# Patient Record
Sex: Female | Born: 1965 | Race: Asian | Hispanic: No | Marital: Single | Smoking: Never smoker
Health system: Southern US, Community
[De-identification: ages and names within clinical notes are randomized; demographics above are authoritative.]

## PROBLEM LIST (undated history)

## (undated) DIAGNOSIS — T7840XA Allergy, unspecified, initial encounter: Secondary | ICD-10-CM

## (undated) DIAGNOSIS — K219 Gastro-esophageal reflux disease without esophagitis: Secondary | ICD-10-CM

## (undated) DIAGNOSIS — R109 Unspecified abdominal pain: Secondary | ICD-10-CM

## (undated) DIAGNOSIS — R42 Dizziness and giddiness: Secondary | ICD-10-CM

## (undated) DIAGNOSIS — R079 Chest pain, unspecified: Secondary | ICD-10-CM

## (undated) DIAGNOSIS — E785 Hyperlipidemia, unspecified: Secondary | ICD-10-CM

## (undated) HISTORY — DX: Hyperlipidemia, unspecified: E78.5

## (undated) HISTORY — DX: Allergy, unspecified, initial encounter: T78.40XA

## (undated) HISTORY — DX: Chest pain, unspecified: R07.9

## (undated) HISTORY — DX: Dizziness and giddiness: R42

## (undated) HISTORY — DX: Gastro-esophageal reflux disease without esophagitis: K21.9

## (undated) HISTORY — DX: Unspecified abdominal pain: R10.9

---

## 1999-02-15 ENCOUNTER — Encounter: Payer: Self-pay | Admitting: Obstetrics and Gynecology

## 1999-02-15 ENCOUNTER — Ambulatory Visit (HOSPITAL_COMMUNITY): Admission: RE | Admit: 1999-02-15 | Discharge: 1999-02-15 | Payer: Self-pay | Admitting: *Deleted

## 1999-02-19 ENCOUNTER — Encounter: Payer: Self-pay | Admitting: *Deleted

## 1999-02-19 ENCOUNTER — Ambulatory Visit (HOSPITAL_COMMUNITY): Admission: RE | Admit: 1999-02-19 | Discharge: 1999-02-19 | Payer: Self-pay | Admitting: *Deleted

## 1999-04-26 ENCOUNTER — Ambulatory Visit (HOSPITAL_COMMUNITY): Admission: RE | Admit: 1999-04-26 | Discharge: 1999-04-26 | Payer: Self-pay | Admitting: Obstetrics and Gynecology

## 1999-07-26 ENCOUNTER — Encounter (HOSPITAL_COMMUNITY): Admission: AD | Admit: 1999-07-26 | Discharge: 1999-07-31 | Payer: Self-pay | Admitting: Obstetrics and Gynecology

## 1999-07-29 ENCOUNTER — Inpatient Hospital Stay (HOSPITAL_COMMUNITY): Admission: AD | Admit: 1999-07-29 | Discharge: 1999-07-29 | Payer: Self-pay | Admitting: Obstetrics and Gynecology

## 1999-07-30 ENCOUNTER — Encounter (INDEPENDENT_AMBULATORY_CARE_PROVIDER_SITE_OTHER): Payer: Self-pay

## 1999-07-30 ENCOUNTER — Inpatient Hospital Stay (HOSPITAL_COMMUNITY): Admission: AD | Admit: 1999-07-30 | Discharge: 1999-08-03 | Payer: Self-pay | Admitting: Obstetrics and Gynecology

## 1999-08-12 ENCOUNTER — Encounter: Payer: Self-pay | Admitting: Obstetrics and Gynecology

## 1999-08-12 ENCOUNTER — Ambulatory Visit (HOSPITAL_COMMUNITY): Admission: RE | Admit: 1999-08-12 | Discharge: 1999-08-12 | Payer: Self-pay | Admitting: Obstetrics and Gynecology

## 1999-08-25 ENCOUNTER — Encounter: Payer: Self-pay | Admitting: Obstetrics and Gynecology

## 1999-08-25 ENCOUNTER — Inpatient Hospital Stay (HOSPITAL_COMMUNITY): Admission: AD | Admit: 1999-08-25 | Discharge: 1999-08-27 | Payer: Self-pay | Admitting: Obstetrics and Gynecology

## 2000-01-24 ENCOUNTER — Other Ambulatory Visit: Admission: RE | Admit: 2000-01-24 | Discharge: 2000-01-24 | Payer: Self-pay | Admitting: Obstetrics and Gynecology

## 2001-02-18 ENCOUNTER — Other Ambulatory Visit: Admission: RE | Admit: 2001-02-18 | Discharge: 2001-02-18 | Payer: Self-pay | Admitting: Internal Medicine

## 2003-01-10 ENCOUNTER — Encounter: Admission: RE | Admit: 2003-01-10 | Discharge: 2003-01-10 | Payer: Self-pay | Admitting: Otolaryngology

## 2003-01-10 ENCOUNTER — Encounter: Payer: Self-pay | Admitting: Otolaryngology

## 2005-04-25 ENCOUNTER — Ambulatory Visit (HOSPITAL_COMMUNITY): Admission: RE | Admit: 2005-04-25 | Discharge: 2005-04-25 | Payer: Self-pay | Admitting: Internal Medicine

## 2006-05-11 ENCOUNTER — Ambulatory Visit (HOSPITAL_COMMUNITY): Admission: RE | Admit: 2006-05-11 | Discharge: 2006-05-11 | Payer: Self-pay | Admitting: Obstetrics and Gynecology

## 2006-05-22 ENCOUNTER — Encounter: Admission: RE | Admit: 2006-05-22 | Discharge: 2006-05-22 | Payer: Self-pay | Admitting: Obstetrics and Gynecology

## 2007-05-13 HISTORY — PX: OOPHORECTOMY: SHX86

## 2007-05-24 ENCOUNTER — Ambulatory Visit (HOSPITAL_COMMUNITY): Admission: RE | Admit: 2007-05-24 | Discharge: 2007-05-24 | Payer: Self-pay | Admitting: Obstetrics and Gynecology

## 2007-07-08 ENCOUNTER — Encounter (INDEPENDENT_AMBULATORY_CARE_PROVIDER_SITE_OTHER): Payer: Self-pay | Admitting: Obstetrics and Gynecology

## 2007-07-08 ENCOUNTER — Ambulatory Visit (HOSPITAL_COMMUNITY): Admission: RE | Admit: 2007-07-08 | Discharge: 2007-07-09 | Payer: Self-pay | Admitting: Obstetrics and Gynecology

## 2008-05-29 ENCOUNTER — Ambulatory Visit (HOSPITAL_COMMUNITY): Admission: RE | Admit: 2008-05-29 | Discharge: 2008-05-29 | Payer: Self-pay | Admitting: Obstetrics and Gynecology

## 2009-06-04 ENCOUNTER — Ambulatory Visit (HOSPITAL_COMMUNITY): Admission: RE | Admit: 2009-06-04 | Discharge: 2009-06-04 | Payer: Self-pay | Admitting: Gynecology

## 2010-06-02 ENCOUNTER — Encounter: Payer: Self-pay | Admitting: Obstetrics and Gynecology

## 2010-06-05 ENCOUNTER — Ambulatory Visit (HOSPITAL_COMMUNITY)
Admission: RE | Admit: 2010-06-05 | Discharge: 2010-06-05 | Payer: Self-pay | Source: Home / Self Care | Attending: Obstetrics and Gynecology | Admitting: Obstetrics and Gynecology

## 2010-09-24 NOTE — Op Note (Signed)
NAMEJAYLIAH, [name]                   ACCOUNT NO.:  [id number]   MEDICAL RECORD NO.:  [id number]          PATIENT TYPE:  OIB   LOCATION:  9305                          FACILITY:  [facility]   PHYSICIAN:  Malachi Pro. Ambrose Mantle, M.D. 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   OPERATION:  Left salpingo-oophorectomy.   SURGEON:  Dr. Tracey Harries.   ANESTHESIA:  General anesthesia.   The patient was brought to the operating room and placed under  satisfactory general anesthesia.  She was placed in frog-leg position.  The abdomen, vagina and urethra were prepped with Betadine solution. I  attempted to insert a 16-gauge Foley catheter but the urethra was too  small.  We therefore waited for a 12-gauge catheter. It easily entered  the urethra and it was then hooked to straight drain.  The patient was  placed supine.  The abdomen was draped as a sterile field.  The old C-  section incision was difficult to identify but I think I was able to  identify it and I used approximately 4 inches of it, 2 inches on either  side of the midline to go through the skin, subcutaneous tissue and  fascia. The fascia was enlarged transversely, separated from the rectus  muscles superiorly and inferiorly. The rectus muscle was divided in the  midline.  The peritoneum was opened vertically.  The incision was then  stretched to allow visualization.  I did pelvic washings. Because of the  small incision, I was unable to examine the upper abdomen. There did not  appear to be any pelvic abnormalities other than the left ovary. The  posterior cul-de-sac was normal.  The uterus which was retroverted was  raised into the operative field, it appeared normal. The anterior cul-de-  sac was normal.  The right tube and ovary were  completely normal. There  was no evidence of any ovarian pathology on the right. The left ovary  contained what appeared to be a dermoid. The left tube was normal. The  left ovary was about 3-1/2 cm in diameter. I had hoped to be able to  shell out the dermoid and leave a pretty much normal ovary, however, on  thorough inspection it did not appear to be any significant amount of  normal ovarian tissue other than what was in the dermoid. I therefore  made a decision to do a left salpingo-oophorectomy.  I identified the  infundibulopelvic ligament, divided it between two clamps and then took  most of the left tube along with the remainder of the ovary along the  mesovarium and mesosalpinx. I doubly suture ligated both clamps, bovied  the area in between the two  ligatures and sent the left tube and ovary  for frozen section. I had done pelvic washings and seeing no other  pathology, I went ahead and closed the abdominal wall with interrupted  sutures of #  0 Vicryl on the rectus muscle and peritoneum as one layer,  then I closed the fascia with two running sutures of #0 Vicryl, the  subcu tissue with a running 3-0 Vicryl and the skin was closed with  automatic staples.  The patient seemed to tolerate the procedure well.  At the end of the procedure Dr. 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 I  asked him if I should send the washings, he said it never hurt to send  the washings  in case further section showed an abnormality or malignancy, so  therefore the pelvic washings were sent. The patient seemed to tolerate  the procedure well.  Blood loss was less than 25 mL.  Sponge and needle  counts were correct and she was returned to recovery in satisfactory  condition.      Malachi Pro. Ambrose Mantle, M.D.  Electronically Signed     TFH/MEDQ  D:  07/08/2007  T:  07/09/2007  Job:  [id number]

## 2010-09-24 NOTE — H&P (Signed)
[name], [name]                   ACCOUNT NO.:  [id number]   MEDICAL RECORD NO.:  [id number]           PATIENT TYPE:   LOCATION:                                 FACILITY:   PHYSICIAN:  Malachi Pro. Ambrose Mantle, M.D. DATE OF BIRTH:  Apr 19, 1966   DATE OF ADMISSION:  07/08/2007  DATE OF DISCHARGE:                              HISTORY & PHYSICAL   HISTORY OF PRESENT ILLNESS:  This is a 45 year old [place] female  para 1-0-1-1 whose last menstrual period was June 11, 2007, who was  admitted to the hospital for surgical exploration of the left adnexal  mass that by ultrasound appears to be a dermoid.  This patient was seen  for yearly exam on May 31, 2007, at which time she complained that  sometimes during intercourse it felt like the penis was hitting  something.  The uterus was upper limit of normal size, posterior and  tender.  There was a left ovary that felt about 3 x 3 cm.  Ultrasound  showed a probable left ovarian dermoid.  There was a retroverted uterus  and the left ovary appeared to have a 3.1 x 4.3 x 2.7 cm hyperechoic  mass extending from the superior surface of the left ovary compatible  with a dermoid.  The ultrasound was repeated after her period in early  February and again showed the same findings.  At this time the highly  echogenic mass measured 3.25 x 2.54 x 2.15 cm.  The patient was advised  to consider surgery because of the presence of what appeared to be the  dermoid.   PAST MEDICAL HISTORY:  Reveals no known drug allergies.  She has had a C-  section.  She has had no major illnesses except for allergies to  different allergens.   REVIEW OF SYSTEMS:  Noncontributory.   SOCIAL HISTORY:  She is unemployed.  She does not drink or smoke.   FAMILY HISTORY:  Her mother and father are 49 and 22 respectively,  living and well.  She has 9 siblings.   MEDICATIONS:  Claritin.   PHYSICAL EXAM:  VITAL SIGNS:  Blood pressure 100/60, pulse 80, weight is  101  pounds.  GENERAL:  She is a well-developed, well-nourished [place] female in  no distress.  HEENT:  Reveal no cranial abnormalities.  Extraocular movements are  intact.  Nose and pharynx are clear.  NECK:  Supple without thyromegaly.  HEART:  Normal size and sounds.  No murmurs.  LUNGS:  Lungs are clear to auscultation.  ABDOMEN:  The abdomen is soft and flat, nontender.  The liver, spleen  and kidneys are not felt.  Vulva and vagina are clean.  Cervix is clean.  Uterus is retroverted, upper limit of normal size.  The right adnexa is  clear.  Left ovary feels 3 x 3 cm.   ADMITTING IMPRESSION:  Left adnexal mass presumably a dermoid on  ultrasound examination.  The patient is admitted for surgical  exploration, removal of the mass, hopefully to leave the remainder of  the ovary.  The patient does not  want hysterectomy or removal of the  other ovary.  The other ovary will be inspected carefully to see if  there is any evidence of bilaterality of the problem.  She has been  counseled about the surgical risks with the aid of an interpreter,  including but not limited to pulmonary embolus, wound disruption,  hemorrhage with need for reoperation and/or transfusion, nerve injury,  intestinal obstruction.  She understands and agrees to proceed.      Malachi Pro. Ambrose Mantle, M.D.  Electronically Signed     TFH/MEDQ  D:  07/07/2007  T:  07/07/2007  Job:  [id number]

## 2010-09-27 NOTE — Discharge Summary (Signed)
[facility]  Patient:    [name], [name] [facility]                    MRN: [id number] Adm. Date:  [id number] Disc. Date: [id number] Attending:  Malon Kindle                           Discharge Summary  ADMISSION DIAGNOSIS:          Intrauterine pregnancy at 40 weeks.  DISCHARGE DIAGNOSES:          1. Intrauterine pregnancy at 40 weeks.                               2. Cephalopelvic disproportion.                               3. Uterine leiomyomas.  PROCEDURES:                   Primary low transverse cesarean section.  COMPLICATIONS:                None.  CONSULTATIONS:                None.  HISTORY AND PHYSICAL:         This is a 45 year old [place] female gravida 1  para 0 with an EGA of [redacted] weeks by a 12-week ultrasound, with an EDC of July 26, 1999, who presented to the office with a complaint of regular contractions and as found to be 5 cm dilated.  Prenatal care was complicated by advanced maternal age, for which she had an amniocentesis which revealed normal 46,XX karyotype.  LABORATORY DATA:              Prenatal labs revealed blood type B positive with a negative antibody screen, STS nonreactive, rubella immune, hepatitis B surface antigen negative, HIV negative, gonorrhea and chlamydia negative. Amniocentesis revealed 46,XX.  Three-hour GTT was 85, 140, 178, and 127.  Group B strep was negative.  PAST HISTORY:                 Noncontributory.  FINDINGS ON ADMISSION:        Physical exam was significant for she was afebrile with stable vital signs.  Abdomen was soft with a fundal height of 40 cm. Fetal heart tracing was normal without decelerations.  On Dr. Andreas Newport first exam in the hospital, she was 6 cm, complete, and -2, and artificial rupture of membranes revealed light meconium fluid.  HOSPITAL COURSE:              Patient was admitted in labor and, as mentioned above, Dr. Ambrose Mantle performed an artificial rupture of  membranes for labor augmentation.  She had a protracted course and was started on Pitocin.  She did  eventually reach completely dilated and pushed for approximately three hours. he fetal heart tracing did have some decelerations.  After pushing for three hours she did not bring the vertex past +1 station, and was thus taken for a C section for CPD.                                On March 21, Dr. Ambrose Mantle performed a low transverse cesarean section under  epidural anesthesia.  She delivered a viable female infant with Apgars of 9 and 9 that weighed 7 pounds 11 ounces.  Blood loss was 1000 cc and there was noted to be a small uterine myoma.  Postpartum, she became febrile on  postpartum day #1.  TMAX was 101.2.  She was placed by Dr. Ambrose Mantle on Kefzol prophylaxis from the C section, and this was changed to Unasyn 1.5 g q.6h. to treat for endometritis, once she became febrile.  Otherwise, she did well, was rapidly able to ambulate and tolerate a regular diet.  Her last significant temperature was 100.8 at 0400 on March 22.  On the afternoon of March 23, her IV infiltrated and her antibiotics were stopped, as she had been afebrile for approximately 24 hours. She continued to remain afebrile.  She did have some mild abdominal discomfort rom gas distension, but on the morning of postoperative day #3, being March 24, she  requested discharge home.  At this time, her incision was healing well and her staples were removed and Steri-Strips applied.  Preoperative hemoglobin was 13.0, postoperative was 10.2.  At this time, she is felt to be stable enough for discharge home.  DISCHARGE INSTRUCTIONS:  DIET:                         Regular.  ACTIVITY:                     Pelvic rest, no strenuous activity, no driving.  FOLLOW-UP:                    In 10 days for an incision check.  MEDICATIONS:                  Percocet p.r.n. pain.  INSTRUCTIONS:                 She was given our  discharge summary pamphlet. DD:  08/03/99 TD:  08/05/99 Job: [id number]

## 2010-09-27 NOTE — Discharge Summary (Signed)
NAMETUYET, BADER                   ACCOUNT NO.:  [id number]   MEDICAL RECORD NO.:  [id number]          PATIENT TYPE:  OIB   LOCATION:  9305                          FACILITY:  [facility]   PHYSICIAN:  Malachi Pro. Ambrose Mantle, M.D. DATE OF BIRTH:  12-21-1965   DATE OF ADMISSION:  07/08/2007  DATE OF DISCHARGE:  07/09/2007                               DISCHARGE SUMMARY   This is a 45 year old [place] female para 1-0-1-1 who is admitted to  the hospital for removal of what was presumed to be a dermoid cyst of  the left ovary.  She underwent laparotomy with left salpingo-  oophorectomy.  The entire ovary was removed because there seemed to be  no normal ovarian tissue.  Frozen section revealed a benign dermoid, and  the final permanent section showed mature cystic teratoma, no immature  or malignant features noted.  The ovary measured 4 x 2.5 cm in greatest  dimensions and weighed 16 g.  On cut section, there was pale yellow  pasty material and black hair.  The inner cyst wall was smooth except  for 1 x 0.8 cm area being covered by white skin, partially covered by  black hair.  Peritoneal washings were negative.  Postoperatively, the  patient did well and was discharged on the first postoperative day.  Initial hemoglobin was 12.9, hematocrit 37.1, white count 6800, and  platelet count 221,000.  Followup hematocrit were 30.4 and 30.6.  Comprehensive metabolic profile was normal.  Estimated glomerular  filtration rate was greater than 60.   FINAL DIAGNOSIS:  Left ovarian cystic teratoma.   OPERATION:  Left salpingo-oophorectomy.   FINAL CONDITION:  Improved.   DISCHARGE INSTRUCTIONS:  Include regular discharge instructions.  No  vaginal entrance.  No heavy lifting.  Report any temperature greater  than 100.4 degrees and report any unusual problems.  Return to the  office in 3 days for staple removal.  Percocet 5/325 thirty tablets one  every 4-6 hours as needed for pain is given at  discharge.      Malachi Pro. Ambrose Mantle, M.D.  Electronically Signed     TFH/MEDQ  D:  07/30/2007  T:  07/30/2007  Job:  [id number]

## 2010-09-27 NOTE — Op Note (Signed)
[facility]  Patient:    [name], [name] [facility]                    MRN: [id number] Proc. Date: 07/31/99 Adm. Date:  [id number] Disc. Date: [id number] Attending:  Oliver Pila                           Operative Report  PREOPERATIVE DIAGNOSES:       Intrauterine pregnancy, 40 weeks, relative cephalopelvic disproportion.  POSTOPERATIVE DIAGNOSES:      Intrauterine pregnancy, 40 weeks, relative cephalopelvic disproportion.  Small uterine fibroid.  OPERATION:                    Low transverse cervical cesarean section.  SURGEON:                      Malachi Pro. Ambrose Mantle, M.D.  ANESTHESIA:                   Epidural.  DESCRIPTION OF PROCEDURE:     The patient was brought to the operating room and the epidural anesthesia was bolused.  A Foley catheter was indwelling.  She was in he left lateral tilt position.  The abdomen was prepped with Betadine solution and  draped as a sterile field.  An incision was made in the lower abdomen transversely and carried in layers through the skin and subcutaneous tissue and fascia.  The  fascia was then separated from the rectus muscles superiorly and inferiorly. The rectus muscle was split in the midline.  The peritoneum was opened vertically. An incision was made into the lower uterine segment peritoneum.  The bladder was pulled inferiorly and incision was made into the lower uterine segment.  Fluid as seen. It was slightly meconium-stained.  The incision was extended transversely  with the bandage scissors.  The infants vertex was then lifted out of the pelvis and delivered through the incisional opening.  The nose and mouth was suctioned  with the bulb and the DeLee, then the remainder of the body was delivered.  The  cord was clamped.  The infant was given to the neonatologist, who was in attendance.  The infant was a female, 7 pounds 11 ounces, Apgars 9 at one and 9 at five minutes, delivered at 12:22  a.m. on July 31, 1999.  The placenta was then  handled in the following fashion:  The segment of cord was preserved in case a pH was necessary.  Routine cord blood studies were obtained and then the placenta as removed from the uterus.  The cervix was already completely dilated so it did not need to be dilated further.  The inside of the uterus was inspected and found to be free of any remaining products.  There was a 1.5 to 2 cm fibroid on the anterior right upper fundus.  It was not removed.  Both tubes and ovaries appeared completely normal.  The uterine incision was then closed in two layers, using a  running lock suture of 0 Vicryl in the first layer and a nonlocking suture of the same material on the second layer, a couple of extra sutures were required for complete hemostasis.   Liberal irrigation confirmed hemostasis. Reperitonealization over the lower uterine segment was done with a running suture of 0 Vicryl.  The abdominal wall was then closed in layers using a running suture of 0 Vicryl  in the peritoneum, interrupted 0 Vicryl in the rectus muscle, two running sutures of 0 Vicryl on the rectus fascia, a running 3-0 Vicryl in the subcu tissue and staples on the skin.  The patient seemed to tolerate the procedure well. Blood loss was about 1000 cc.  Sponge and needle counts were correct.  She was returned to recovery in satisfactory condition. DD:  07/31/99 TD:  07/31/99 Job: [id number]

## 2010-09-27 NOTE — H&P (Signed)
[facility]  Patient:    [name], [name] [facility]                    MRN: [id number] Adm. Date:  [id number] Disc. Date: [id number] Attending:  Oliver Pila CC:         Malachi Pro. Ambrose Mantle, M.D.                         History and Physical  HISTORY:                      This 45 year old [place] married female, para , gravida 1, with last period on October 24, 1998, and [facility] of July 31, 1999, by dates and July 26, 1999, by ultrasound was admitted in active labor.  Blood group and type B-positive, with a negative antibody.  Nonreactive serology, rubella positive, hepatitis-B surface antigen negative.  HIV negative.  GC and Chlamydia negative. Amniotic fluid 46XX.  A 3-hour GTT 85, 140, 178, 127. Group-B strept negative. A vaginal ultrasound on January 16, 1999:  Crown-rump length 5.9 cm, 12 weeks, 4  days.  [facility] July 26, 1999.  Ultrasound on February 15, 1999:  Average gestational age [redacted] weeks, 4 days, [facility] July 22, 1999.  Amnio 46XX, normal AFP.  The prenatal course was uncomplicated.   The patient began contracting during the night.  She came o the office on July 30, 1999.  Her cervix was 5.0 cm, contractions q.10 minutes. She was sent to maternity admission and to labor and delivery.  ALLERGIES:                    No known drug allergies.  PAST SURGICAL HISTORY:        None.  PAST MEDICAL HISTORY:         Usual childhood diseases.  FAMILY HISTORY:               Father with high blood pressure.  SOCIAL HISTORY:               Alcohol, tobacco, and drugs:  None.  PHYSICAL EXAMINATION:  GENERAL:                      A well-developed, very short [place] female in  distress from contractions.  VITAL SIGNS:                  Temperature 97.6 degrees, pulse 96, respirations 0, blood pressure 103/72.  HEENT:                        Negative.  HEART:                        Normal size and sounds.  No murmurs.  LUNGS:                         Clear to P&A.  ABDOMEN:                      Soft, fundal height 40.0 cm.  Fetal heart tones normal.  No decelerations on admission.  PELVIC:                       Cervix 6.0 cm, 100%, vertex -2.  An artificial rupture of the  membranes produced light meconium.  The patient was placed on IV Pitocin.  She became uncontrollable but finally consented to an epidural.  At 7:40 p.m. she was resting comfortably. Contractions were every two to three minutes.  Cervix was 8.0 to 9.0 cm., 100, vertex -1. At 9:06 p.m. the patient became fully dilated.  She began pushing, but at 9:25 p.m. had a two to three-minute deceleration, followed by some late decelerations. Position was changed.  The dosing with Pitocin was discontinued.  O2 was begun.  Fetal heart rate then improved.  She did continue to have intermittent decelerations.  She pushed for almost three hours.  At 11:43 p.m. the vertex remained at a 0 to +1 station, LOA to LOT, but I could not reach the anterior fontanelle.  The leading part of the vertex never reached an inch from the introitus.  The fetal heart rate continued to show occasional decelerations. After almost three hours of pushing, with minimal descent in the last hour, I feel the patient has cephalopelvic disproportion.  IMPRESSION:                   1. Intrauterine pregnancy 40 weeks.                               2. Relative cephalopelvic disproportion.  PLAN:                         The patient is prepared for a cesarean section. DD:  07/30/99 TD:  07/31/99 Job: [id number]

## 2010-09-27 NOTE — Discharge Summary (Signed)
[facility] of [facility]  Patient:    [name], [name]                          MRN: [id number] Adm. Date:  [id number] Disc. Date: [id number] Attending:  Michaele Offer                           Discharge Summary  HISTORY OF PRESENT ILLNESS:   This is a 45 year old [place] female, para 1-0-0-1, had a cesarean section on July 31, 1999, for CPD after an uncomplicated prenatal course.  Presented complaining of pelvic pain, left greater than right, started gradually at approximately 10 a.m. on August 25, 1999.  The pain felt like a tearing and had gradually worsened, associated with nausea and vomiting x 3, watery bowel movements x 2-3 on the day of admission, with last normal BM three days prior.  The patient thought she had a fever, but no chills.  PAST MEDICAL HISTORY:         Negative.  PAST SURGICAL HISTORY:        Low transverse cervical C-section.  ALLERGIES:                    No known drug allergies.  MEDICATIONS:                  None.  SOCIAL HISTORY:               The patient is married.  No tobacco.  PHYSICAL EXAMINATION:  VITAL SIGNS:                  Temperature 97.4 axillary, pulse 61, respirations 29, blood pressure 112/57.  HEENT:                        Normal.  HEART:                        Normal.  LUNGS:                        Clear.  ABDOMEN:                      Soft, nondistended, no mass.  She was tender in the lower midline and left lower quadrant.  No rebound or guarding.  Incision was healing well.  No erythema or induration.  EXTREMITIES:                  Negative.  No CVA tenderness.  PELVIC:                       The os was closed.  Retroverted uterus about 10 weeks size.  It was slightly tender and reproduced pain.  No adnexal masses ere palpable.  LABORATORY DATA:              The patients comprehensive metabolic profile was normal except for slightly elevated alkaline phosphatase.  Hemoglobin was  14.7, hematocrit 40.5, white count 13,700, platelet count 246,000.  ADMISSION IMPRESSION:         Pelvic pain with elevated white count and left shift, tender uterus, three weeks after cesarean section.  Dr. Jackelyn Knife felt this was an atypical presentation for endometritis and being afebrile, but he found no other source, so  he placed her on IV Unasyn, and obtained a flat and upright of the abdomen, a pelvic ultrasound, urinalysis, and wanted a follow-up CBC and comprehensive metabolic.  HOSPITAL COURSE:              The patients hospital course was somewhat difficult to evaluate because of the great language barrier.  However, she did seem to improve.  She received an enema, had a bowel movement.  Her abdomen remained soft and flat, nontender.  She did complain of some epigastric pain after eating and  drinking, so I placed her on Zantac, and that seemed to improve that situation. An amylase and lipase were done, which were normal.  Urinalysis was completely negative.  Initial white count, as stated, was 13,700, with 76 segs, 18 lymphs, and 2 monos.  Comprehensive metabolic profile was normal except for an alkaline phosphatase of 135, felt to be related to the recent pregnancy.  Follow-up white count was 6600, hemoglobin 12.9, hematocrit 36.4, platelet count was normal. On the follow-up CBC, the segs were 47, lymphocytes 38, 6 eosinophils.  Follow-up comprehensive metabolic profile was normal except for a potassium of 3.4, albumin of 2.7.  Pelvic ultrasound was normal.  Flat and upright of the abdomen was normal. Bowel gas pattern was normal.  No free air was noted.  FINAL DIAGNOSIS:              Three weeks status post cesarean section with abdominal pain of undetermined origin, possible acid reflux, possible endometritis. The patient was treated with Unasyn.  DISCHARGE MEDICATIONS:        1. Augmentin 500 mg every eight hours for four days.                               2. Zantac 150 mg twice daily for acid reflux.  FOLLOW-UP:                    She is to return to the office as scheduled in two weeks.  DISCHARGE INSTRUCTIONS:       Call if she has other problems. DD:  08/27/99 TD:  08/27/99 Job: [id number]

## 2011-02-03 LAB — CBC
HCT: 34.4 — ABNORMAL LOW
Hemoglobin: 10.9 — ABNORMAL LOW
Hemoglobin: 12.1
Hemoglobin: 12.9
MCHC: 34.8
MCHC: 35.1
MCV: 94.5
Platelets: 184
RBC: 3.64 — ABNORMAL LOW
RDW: 12
RDW: 12
WBC: 9.6

## 2011-02-03 LAB — COMPREHENSIVE METABOLIC PANEL
ALT: 16
Calcium: 9.1
GFR calc Af Amer: >60
Glucose, Bld: 93
Sodium: 139
Total Protein: 6.9

## 2011-02-03 LAB — DIFFERENTIAL
Eosinophils Absolute: 0.5
Lymphs Abs: 2.2
Monocytes Relative: 7
Neutro Abs: 3.6
Neutrophils Relative %: 53

## 2011-05-26 ENCOUNTER — Other Ambulatory Visit (HOSPITAL_COMMUNITY): Payer: Self-pay | Admitting: Obstetrics and Gynecology

## 2011-05-26 DIAGNOSIS — Z1231 Encounter for screening mammogram for malignant neoplasm of breast: Secondary | ICD-10-CM

## 2011-06-23 ENCOUNTER — Ambulatory Visit (HOSPITAL_COMMUNITY)
Admission: RE | Admit: 2011-06-23 | Discharge: 2011-06-23 | Disposition: A | Discharge status: Home / Self Care | Payer: Self-pay | Source: Ambulatory Visit | Attending: Obstetrics and Gynecology | Admitting: Obstetrics and Gynecology

## 2011-06-23 DIAGNOSIS — Z1231 Encounter for screening mammogram for malignant neoplasm of breast: Secondary | ICD-10-CM

## 2012-05-24 ENCOUNTER — Other Ambulatory Visit (HOSPITAL_COMMUNITY): Payer: Self-pay | Admitting: Obstetrics and Gynecology

## 2012-05-24 DIAGNOSIS — Z1231 Encounter for screening mammogram for malignant neoplasm of breast: Secondary | ICD-10-CM

## 2012-06-02 ENCOUNTER — Other Ambulatory Visit (HOSPITAL_COMMUNITY): Payer: Self-pay | Admitting: Obstetrics and Gynecology

## 2012-06-02 DIAGNOSIS — Z1231 Encounter for screening mammogram for malignant neoplasm of breast: Secondary | ICD-10-CM

## 2012-06-24 ENCOUNTER — Ambulatory Visit (HOSPITAL_COMMUNITY): Payer: Self-pay

## 2012-07-01 ENCOUNTER — Ambulatory Visit (HOSPITAL_COMMUNITY)
Admission: RE | Admit: 2012-07-01 | Discharge: 2012-07-01 | Disposition: A | Discharge status: Home / Self Care | Payer: Self-pay | Source: Ambulatory Visit | Attending: Obstetrics and Gynecology | Admitting: Obstetrics and Gynecology

## 2012-07-01 DIAGNOSIS — Z1231 Encounter for screening mammogram for malignant neoplasm of breast: Secondary | ICD-10-CM

## 2012-10-17 ENCOUNTER — Emergency Department (HOSPITAL_COMMUNITY)
Admission: EM | Admit: 2012-10-17 | Discharge: 2012-10-17 | Disposition: A | Discharge status: Home / Self Care | Payer: BC Managed Care – PPO | Source: Home / Self Care | Attending: Emergency Medicine | Admitting: Emergency Medicine

## 2012-10-17 ENCOUNTER — Encounter (HOSPITAL_COMMUNITY): Payer: Self-pay

## 2012-10-17 ENCOUNTER — Emergency Department (INDEPENDENT_AMBULATORY_CARE_PROVIDER_SITE_OTHER): Payer: BC Managed Care – PPO

## 2012-10-17 DIAGNOSIS — M778 Other enthesopathies, not elsewhere classified: Secondary | ICD-10-CM

## 2012-10-17 DIAGNOSIS — M65839 Other synovitis and tenosynovitis, unspecified forearm: Secondary | ICD-10-CM

## 2012-10-17 DIAGNOSIS — L739 Follicular disorder, unspecified: Secondary | ICD-10-CM

## 2012-10-17 DIAGNOSIS — L738 Other specified follicular disorders: Secondary | ICD-10-CM

## 2012-10-17 MED ORDER — BACITRACIN ZINC 500 UNIT/GM EX OINT: TOPICAL_OINTMENT | Freq: Two times a day (BID) | CUTANEOUS | Status: AC

## 2012-10-17 MED ORDER — DICLOFENAC SODIUM 50 MG PO TBEC: 50.0000 mg | DELAYED_RELEASE_TABLET | Freq: Two times a day (BID) | ORAL | Status: AC

## 2012-10-17 NOTE — ED Notes (Signed)
Right hand , index finger, pain in the knuckle area, states that it is a old injury and that when its hit it starts to hurt again, also complains of what she describes as a "small bump" on her inner left thigh area

## 2012-10-17 NOTE — ED Provider Notes (Signed)
History     CSN: [id number]  Arrival date & time 10/17/12  1429   First MD Initiated Contact with Patient 10/17/12 1512      Chief Complaint  Patient presents with  . Hand Pain    (Consider location/radiation/quality/duration/timing/severity/associated sxs/prior treatment) HPI Comments: Been having R index finger pain in the knuckle area for many months on and off, a couple of days injured knuckle started hurting again and became swollen. Hurts at touch and with movement... Small bump on the Right groin area, tender at touch...  Patient is a 47 y.o. female presenting with hand pain. The history is provided by the patient.  Hand Pain This is a recurrent problem. The problem occurs constantly. The problem has not changed since onset.Exacerbated by: moveement.    History reviewed. No pertinent past medical history.  History reviewed. No pertinent past surgical history.  No family history on file.  History  Substance Use Topics  . Smoking status: Never Smoker   . Smokeless tobacco: Not on file  . Alcohol Use: No    OB History   Grav Para Term Preterm Abortions TAB SAB Ect Mult Living                  Review of Systems  Constitutional: Negative for fever and chills.  Musculoskeletal: Positive for joint swelling.  Skin: Negative for color change, rash and wound.  Neurological: Negative for numbness.    Allergies  Review of patient's allergies indicates no known allergies.  Home Medications   Current Outpatient Rx  Name  Route  Sig  Dispense  Refill  . LORATADINE PO   Oral   Take by mouth as needed.         . Ranitidine HCl (ZANTAC PO)   Oral   Take by mouth as needed.         . bacitracin ointment   Topical   Apply topically 2 (two) times daily. Apply bid x 5 days   120 g   0   . diclofenac (VOLTAREN) 50 MG EC tablet   Oral   Take 1 tablet (50 mg total) by mouth 2 (two) times daily.   15 tablet   0     There were no vitals taken for this  visit.  Physical Exam  Nursing note and vitals reviewed. Constitutional: She appears well-developed and well-nourished.  Musculoskeletal: She exhibits tenderness.       Hands: Skin: No erythema.       ED Course  Procedures (including critical care time)  Labs Reviewed - No data to display Dg Hand Complete Right  10/17/2012   *RADIOLOGY REPORT*  Clinical Data: Right hand pain at the second MCP joint.  No injury.  RIGHT HAND - COMPLETE 3+ VIEW  Comparison: None.  Findings: Anatomic alignment.  There is no fracture.  Joint spaces appear preserved.  No erosive changes are identified.  Dense fragments are present over the ulnar aspect of the terminal phalanx of the thumb, presumably from remote injury.  The second MCP joint appears within normal limits.  IMPRESSION:  1.  No acute osseous abnormality.  Normal appearance of the second MCP joint without erosions or joint space narrowing. 2.  Radiodense fragments, likely metallic foreign bodies overlying the terminal phalanx of the thumb.   Original Report Authenticated By: Andreas Newport, M.D.     1. Tendinitis of finger   2. Folliculitis       MDM  MCP Synovitis-tendinitis- MELOXICAM Folliculitis  Jimmie Molly, MD 10/17/12 2052

## 2013-06-01 ENCOUNTER — Other Ambulatory Visit (HOSPITAL_COMMUNITY): Payer: Self-pay | Admitting: Obstetrics and Gynecology

## 2013-06-01 DIAGNOSIS — Z1231 Encounter for screening mammogram for malignant neoplasm of breast: Secondary | ICD-10-CM

## 2013-07-05 ENCOUNTER — Ambulatory Visit (HOSPITAL_COMMUNITY): Payer: BC Managed Care – PPO

## 2013-07-06 ENCOUNTER — Ambulatory Visit (HOSPITAL_COMMUNITY): Payer: BC Managed Care – PPO

## 2013-07-13 ENCOUNTER — Ambulatory Visit (HOSPITAL_COMMUNITY)
Admission: RE | Admit: 2013-07-13 | Discharge: 2013-07-13 | Disposition: A | Discharge status: Home / Self Care | Payer: BC Managed Care – PPO | Source: Ambulatory Visit | Attending: Obstetrics and Gynecology | Admitting: Obstetrics and Gynecology

## 2013-07-13 DIAGNOSIS — Z1231 Encounter for screening mammogram for malignant neoplasm of breast: Secondary | ICD-10-CM | POA: Insufficient documentation

## 2014-05-30 ENCOUNTER — Encounter: Payer: Self-pay | Admitting: Family Medicine

## 2014-05-30 ENCOUNTER — Ambulatory Visit (INDEPENDENT_AMBULATORY_CARE_PROVIDER_SITE_OTHER): Payer: 59 | Admitting: Family Medicine

## 2014-05-30 VITALS — BP 116/76 | HR 62 | Temp 97.9°F | Ht <= 58 in | Wt 103.0 lb

## 2014-05-30 DIAGNOSIS — Z23 Encounter for immunization: Secondary | ICD-10-CM

## 2014-05-30 DIAGNOSIS — Z0000 Encounter for general adult medical examination without abnormal findings: Secondary | ICD-10-CM | POA: Insufficient documentation

## 2014-05-30 LAB — COMPREHENSIVE METABOLIC PANEL
ALBUMIN: 4.3 g/dL (ref 3.5–5.2)
ALT: 16 U/L (ref 0–35)
AST: 24 U/L (ref 0–37)
Alkaline Phosphatase: 59 U/L (ref 39–117)
BILIRUBIN TOTAL: 0.6 mg/dL (ref 0.2–1.2)
BUN: 12 mg/dL (ref 6–23)
CALCIUM: 9.7 mg/dL (ref 8.4–10.5)
CHLORIDE: 103 meq/L (ref 96–112)
CO2: 27 meq/L (ref 19–32)
CREATININE: 0.58 mg/dL (ref 0.50–1.10)
Glucose, Bld: 84 mg/dL (ref 70–99)
Potassium: 3.6 mEq/L (ref 3.5–5.3)
SODIUM: 140 meq/L (ref 135–145)
Total Protein: 7.2 g/dL (ref 6.0–8.3)

## 2014-05-30 LAB — LIPID PANEL
CHOL/HDL RATIO: 2.7 ratio
Cholesterol: 236 mg/dL — ABNORMAL HIGH (ref 0–200)
HDL: 88 mg/dL (ref >39)
LDL CALC: 131 mg/dL — AB (ref 0–99)
Triglycerides: 86 mg/dL (ref <150)
VLDL: 17 mg/dL (ref 0–40)

## 2014-05-30 LAB — CBC
HCT: 39.3 % (ref 36.0–46.0)
Hemoglobin: 13.6 g/dL (ref 12.0–15.0)
MCH: 31.6 pg (ref 26.0–34.0)
MCHC: 34.6 g/dL (ref 30.0–36.0)
MCV: 91.2 fL (ref 78.0–100.0)
MPV: 10.5 fL (ref 8.6–12.4)
PLATELETS: 283 10*3/uL (ref 150–400)
RBC: 4.31 MIL/uL (ref 3.87–5.11)
RDW: 12.1 % (ref 11.5–15.5)
WBC: 4.6 10*3/uL (ref 4.0–10.5)

## 2014-05-30 LAB — POCT GLYCOSYLATED HEMOGLOBIN (HGB A1C): HEMOGLOBIN A1C: 5.5

## 2014-05-30 MED ORDER — IBUPROFEN 600 MG PO TABS: 600.0000 mg | ORAL_TABLET | Freq: Three times a day (TID) | ORAL | Status: DC | PRN

## 2014-05-30 NOTE — Patient Instructions (Addendum)
It was nice to meet you. I have referred you to a gastroenterologist to have a colonoscopy done. I will call you if your results are not normal.

## 2014-05-30 NOTE — Progress Notes (Signed)
Subjective: UT:MLYYTKPTWSFK care   [place] translator through [place], [employer], used for this encounter entirely.   HPI: Patient is a 49 y.o. female presenting to clinic today to establish care with a PCP. Concerns today include:  1. Flatulance: States she burps a few times per month, normally associated with eating. Is worried about whether this is normal. States she has been taking Zantac for this. Denies any pain/burning with these burps, no foul taste in her mouth. No pain with burping. No difficulty with swallowing. No abdominal pain, nausea, vomiting, or change in BMs.  Health Maintenance:  Pap Smear: 2015 and normal per report (Dr. Ulanda Edison in charge of her pap smears, no desire to change this to our office)  Mammogram: 07/2013 Benign  Colonoscopy: Never Flu vaccine: states she got it Sept 2015 at [facility]: unsure when she had this; per our [facility] records, it has been > 72yrs. LMP: Patient notes her menses stopped 2 years go, has not had spotting. No concerns with hot flashes.    ROS: Patient denies cough, rhinorrhea, fever, chills, chest pain, SOB, swelling, abdominal pain, N/V, diarrhea, constipation, hematochezia, melana, dysuria, hematuria, urinary urgency, urinary frequency, joint swelling, arthralgias, myalgias, weakness, numbness/tingling, change in sensation.   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 ibuprofen (ADVIL,MOTRIN) 600 MG tablet Take 1 tablet (600 mg total) by mouth every 8 (eight) hours as needed for moderate pain. 30 tablet 0  . LORATADINE PO Take by mouth as needed.    . Ranitidine HCl (ZANTAC PO) Take by mouth as needed.     No current facility-administered medications for this visit.   Social History: Works as a Scientist, forensic Never  smoker No recreational drugs Denies alcohol use  No concerns for STD  Patient has a 1 year old daughter  Family history: Mother, alive, healthy, 43y/o Father, alive 42 y/o, diabetes Siblings, 93 of them, all healthy besides occasional colds per patient  Daughter; 68 y/o healthy  Objective: Office vital signs reviewed. BP 116/76 mmHg  Pulse 62  Temp(Src) 97.9 F (36.6 C) (Oral)  Ht 4\' 7"  (1.397 m)  Wt 103 lb (46.72 kg)  BMI 23.94 kg/m2   Physical Examination:  General: Awake, alert, thin, well- nourished, NAD HEENT: Normal    Neck: No masses palpated. No LAD, no thyromegaly    Ears: TMs intact, normal light reflex, no erythema, no bulging    Eyes: PERRLA, EOMI    Nose: nasal turbinates moist    Throat: MMM, no erythema Cardio: RRR, S1S2 heard, no murmurs, rubs or gallops appreciated Pulm: CTAB, no wheezes, rhonchi or crackles noted GI: soft, NT/ND,+BS x4, no hepatomegaly, no splenomegaly GU: deffered Extremities:No edema, cyanosis or clubbing; +2 ulses bilaterally MSK: Normal gait and station. No gross deformities Skin: dry, intact, no rashes or lesions Neuro: Strength and sensation grossly intact  Assessment/Plan: Health maintenance examination Patient up to date on pap smear per her report; will need to obtain records from Dr. Marijean Heath office. Influenza vaccine given this year per pt. Mammogram done 07/2013 (due 07/2014-07/2015).  - As it has been >10 years, will give TDap vaccine today. - Lipid panel today (pt fasting), A1c, U/A, CBC, and CMET.  - Referred for colonoscopy, pt with no family h/o colon cancer.       Orders Placed This  Encounter  Procedures  . Tdap vaccine greater than or equal to 7yo IM  . CBC  . Comprehensive metabolic panel  . Lipid Panel  . Ambulatory referral to Gastroenterology    Referral Priority:  Routine    Referral Type:  Consultation    Referral Reason:  Specialty Services Required    Requested Specialty:  Gastroenterology     Number of Visits Requested:  1  . POCT A1C    Meds ordered this encounter  Medications  . ibuprofen (ADVIL,MOTRIN) 600 MG tablet    Sig: Take 1 tablet (600 mg total) by mouth every 8 (eight) hours as needed for moderate pain.    Dispense:  30 tablet    Refill:  0    Archie Patten, MD PGY-1, [place]

## 2014-05-31 ENCOUNTER — Encounter: Payer: Self-pay | Admitting: Family Medicine

## 2014-05-31 ENCOUNTER — Encounter: Payer: Self-pay | Admitting: Gastroenterology

## 2014-05-31 NOTE — Assessment & Plan Note (Signed)
Patient up to date on pap smear per her report; will need to obtain records from Dr. Marijean Heath office. Influenza vaccine given this year per pt. Mammogram done 07/2013 (due 07/2014-07/2015).  - As it has been >10 years, will give TDap vaccine today. - Lipid panel today (pt fasting), A1c, U/A, CBC, and CMET.  - Referred for colonoscopy, pt with no family h/o colon cancer.

## 2014-06-15 ENCOUNTER — Encounter: Payer: Self-pay | Admitting: Family Medicine

## 2014-06-27 ENCOUNTER — Other Ambulatory Visit (HOSPITAL_COMMUNITY): Payer: Self-pay | Admitting: Obstetrics and Gynecology

## 2014-06-27 DIAGNOSIS — Z1231 Encounter for screening mammogram for malignant neoplasm of breast: Secondary | ICD-10-CM

## 2014-07-10 ENCOUNTER — Ambulatory Visit (AMBULATORY_SURGERY_CENTER): Payer: Self-pay | Admitting: *Deleted

## 2014-07-10 VITALS — Ht <= 58 in | Wt 101.8 lb

## 2014-07-10 DIAGNOSIS — Z1211 Encounter for screening for malignant neoplasm of colon: Secondary | ICD-10-CM

## 2014-07-10 MED ORDER — MOVIPREP 100 G PO SOLR: 1.0000 | Freq: Once | ORAL | Status: DC

## 2014-07-10 NOTE — Progress Notes (Signed)
No home 02 use No diet pills No egg or soy allergy No issues with past sedation emmi video to e mail Interpreter with Pt in [place] today. Questions answered and pt states she understands her instructions.

## 2014-07-17 ENCOUNTER — Ambulatory Visit (HOSPITAL_COMMUNITY)
Admission: RE | Admit: 2014-07-17 | Discharge: 2014-07-17 | Disposition: A | Discharge status: Home / Self Care | Payer: 59 | Source: Ambulatory Visit | Attending: Obstetrics and Gynecology | Admitting: Obstetrics and Gynecology

## 2014-07-17 DIAGNOSIS — Z1231 Encounter for screening mammogram for malignant neoplasm of breast: Secondary | ICD-10-CM

## 2014-07-24 ENCOUNTER — Ambulatory Visit (AMBULATORY_SURGERY_CENTER): Payer: 59 | Admitting: Gastroenterology

## 2014-07-24 ENCOUNTER — Encounter: Payer: Self-pay | Admitting: Gastroenterology

## 2014-07-24 VITALS — BP 113/70 | HR 42 | Temp 97.2°F | Resp 41 | Ht <= 58 in | Wt 101.0 lb

## 2014-07-24 DIAGNOSIS — Z1211 Encounter for screening for malignant neoplasm of colon: Secondary | ICD-10-CM

## 2014-07-24 MED ORDER — SODIUM CHLORIDE 0.9 % IV SOLN: 500.0000 mL | INTRAVENOUS | Status: DC

## 2014-07-24 NOTE — Progress Notes (Signed)
Report to PACU, RN, vss, BBS= Clear.  

## 2014-07-24 NOTE — Patient Instructions (Signed)
YOU HAD AN ENDOSCOPIC PROCEDURE TODAY AT [facility]:   Refer to the procedure report that was given to you for any specific questions about what was found during the examination.  If the procedure report does not answer your questions, please call your gastroenterologist to clarify.  If you requested that your care partner not be given the details of your procedure findings, then the procedure report has been included in a sealed envelope for you to review at your convenience later.  YOU SHOULD EXPECT: Some feelings of bloating in the abdomen. Passage of more gas than usual.  Walking can help get rid of the air that was put into your GI tract during the procedure and reduce the bloating. If you had a lower endoscopy (such as a colonoscopy or flexible sigmoidoscopy) you may notice spotting of blood in your stool or on the toilet paper. If you underwent a bowel prep for your procedure, you may not have a normal bowel movement for a few days.  Please Note:  You might notice some irritation and congestion in your nose or some drainage.  This is from the oxygen used during your procedure.  There is no need for concern and it should clear up in a day or so.  SYMPTOMS TO REPORT IMMEDIATELY:   Following lower endoscopy (colonoscopy or flexible sigmoidoscopy):  Excessive amounts of blood in the stool  Significant tenderness or worsening of abdominal pains  Swelling of the abdomen that is new, acute  Fever of 100F or higher   For urgent or emergent issues, a gastroenterologist can be reached at any hour by calling [phone].   DIET: Your first meal following the procedure should be a small meal and then it is ok to progress to your normal diet. Heavy or fried foods are harder to digest and may make you feel nauseous or bloated.  Likewise, meals heavy in dairy and vegetables can increase bloating.  Drink plenty of fluids but you should avoid alcoholic beverages for 24  hours.  ACTIVITY:  You should plan to take it easy for the rest of today and you should NOT DRIVE or use heavy machinery until tomorrow (because of the sedation medicines used during the test).    FOLLOW UP: Our staff will call the number listed on your records the next business day following your procedure to check on you and address any questions or concerns that you may have regarding the information given to you following your procedure. If we do not reach you, we will leave a message.  However, if you are feeling well and you are not experiencing any problems, there is no need to return our call.  We will assume that you have returned to your regular daily activities without incident.  If any biopsies were taken you will be contacted by phone or by letter within the next 1-3 weeks.  Please call us at [phone] if you have not heard about the biopsies in 3 weeks.    SIGNATURES/CONFIDENTIALITY: You and/or your care partner have signed paperwork which will be entered into your electronic medical record.  These signatures attest to the fact that that the information above on your After Visit Summary has been reviewed and is understood.  Full responsibility of the confidentiality of this discharge information lies with you and/or your care-partner.  Recommendations Discharge instructions given to patient and/or care partner. Next colonoscopy in 10 years.

## 2014-07-24 NOTE — Op Note (Signed)
[place]  [address] [place], [id number]   COLONOSCOPY PROCEDURE REPORT  PATIENT: [name], [name]  MR#: [id number] BIRTHDATE: 05/30/1965 , 48  yrs. old GENDER: female ENDOSCOPIST: Milus Banister, MD PROCEDURE DATE:  07/24/2014 PROCEDURE:   Colonoscopy, screening First Screening Colonoscopy - Avg.  risk and is 50 yrs.  old or older - No.  Prior Negative Screening - Now for repeat screening. N/A  History of Adenoma - Now for follow-up colonoscopy & has been > or = to 3 yrs.  N/A ASA CLASS:   Class II INDICATIONS:Screening for colonic neoplasia and Colorectal Neoplasm Risk Assessment for this procedure is average risk. MEDICATIONS: Monitored anesthesia care and Propofol 200 mg IV  DESCRIPTION OF PROCEDURE:   After the risks benefits and alternatives of the procedure were thoroughly explained, informed consent was obtained.  The digital rectal exam revealed no abnormalities of the rectum.   The [id number]  endoscope was introduced through the anus and advanced to the cecum, which was identified by both the appendix and ileocecal valve. No adverse events experienced.   The quality of the prep was adequate  The instrument was then slowly withdrawn as the colon was fully examined.      COLON FINDINGS: A normal appearing cecum, ileocecal valve, and appendiceal orifice were identified.  The ascending, transverse, descending, sigmoid colon, and rectum appeared unremarkable. Retroflexed views revealed no abnormalities. The time to cecum = 3.9 Withdrawal time = 6.1   The scope was withdrawn and the procedure completed. COMPLICATIONS: There were no immediate complications.  ENDOSCOPIC IMPRESSION: Normal colonoscopy. NO polyps or cancers  RECOMMENDATIONS: You should continue to follow colorectal cancer screening guidelines for "routine risk" patients with a repeat colonoscopy in 10 years.  eSigned:  Milus Banister, MD 07/24/2014 8:47  AM

## 2014-07-25 ENCOUNTER — Telehealth: Payer: Self-pay | Admitting: *Deleted

## 2014-07-25 NOTE — Telephone Encounter (Signed)
  Follow up Call-  Call back number 07/24/2014  Post procedure Call Back phone  # [phone]  Permission to leave phone message Yes     Patient questions:  Message left to call us if necessary.

## 2014-10-23 ENCOUNTER — Telehealth: Payer: Self-pay | Admitting: Family Medicine

## 2014-10-23 NOTE — Telephone Encounter (Signed)
Pt needs an authorization for the colonscopy she had in march A referral was done but not the authorization

## 2014-10-26 NOTE — Telephone Encounter (Signed)
Tried calling patient, line busy. Unable to back date referral more than 5 days through [employer], however, Financial controller GI office states that no auth was required referral notes. Patient should contact their office and speak with billing department.

## 2015-03-08 ENCOUNTER — Ambulatory Visit (INDEPENDENT_AMBULATORY_CARE_PROVIDER_SITE_OTHER): Payer: 59 | Admitting: Family Medicine

## 2015-03-08 ENCOUNTER — Encounter: Payer: Self-pay | Admitting: Family Medicine

## 2015-03-08 VITALS — BP 131/51 | HR 57 | Temp 97.8°F | Wt 100.0 lb

## 2015-03-08 DIAGNOSIS — R42 Dizziness and giddiness: Secondary | ICD-10-CM

## 2015-03-08 DIAGNOSIS — K219 Gastro-esophageal reflux disease without esophagitis: Secondary | ICD-10-CM | POA: Diagnosis not present

## 2015-03-08 HISTORY — DX: Dizziness and giddiness: R42

## 2015-03-08 LAB — CBC
HEMATOCRIT: 39.4 % (ref 36.0–46.0)
HEMOGLOBIN: 13.1 g/dL (ref 12.0–15.0)
MCH: 31.2 pg (ref 26.0–34.0)
MCHC: 33.2 g/dL (ref 30.0–36.0)
MCV: 93.8 fL (ref 78.0–100.0)
MPV: 10.6 fL (ref 8.6–12.4)
PLATELETS: 247 10*3/uL (ref 150–400)
RBC: 4.2 MIL/uL (ref 3.87–5.11)
RDW: 12 % (ref 11.5–15.5)
WBC: 5 10*3/uL (ref 4.0–10.5)

## 2015-03-08 LAB — TSH: TSH: 4.297 u[IU]/mL (ref 0.350–4.500)

## 2015-03-08 MED ORDER — RANITIDINE HCL 150 MG PO TABS: 150.0000 mg | ORAL_TABLET | Freq: Two times a day (BID) | ORAL | Status: DC

## 2015-03-08 NOTE — Assessment & Plan Note (Signed)
One day history of dizziness 3 weeks ago. No further symptoms. No associated cardiac signs/symtpoms. -check TSH and CBC to rule out thyroid issue or anemia -Return precautions given

## 2015-03-08 NOTE — Assessment & Plan Note (Addendum)
One day history of intermittent epigastric/chest sharp pain after drinking water 3 weeks ago. One other episode 10 days ago in [place]. Currently asymptomatic. -atypical in nature, low clinical concern for cardiac etiology, consider EKG if persists -suspect related to GERD, start Zantac 150 mg BID (to take with meals), suspect previous intolerance to PPI was due to taking on empty stomach -Return precautions given

## 2015-03-08 NOTE — Progress Notes (Signed)
   Subjective:    Patient ID: [name], female    DOB: Dec 29, 1965, 49 y.o.   MRN: [id number]  HPI 49 y/o female presents for evaluation of dizziness and epigastric/chest pain. Video interpretor present.  Epigastric/Chest pain - few episodes of sharp pain after drinking water 3 weeks ago, no radiation to arm/neck, no related to exertion, only occurred one day, not associated with solid food, lasted only a few seconds, no nausea/emesis, no orthopnea/PND, had another episode 10 days later (pain in left upper quadrant), again only lasted a few seconds and associated with water, previous history of GERD, Protonix on medication list however not taking, reports "discomfort, scratchy feelin" in abdomen with protonix and omeprazole, reports taking medications 30 minutes prior to meals, continues to have occasional epigastric burning sensation  Dizziness - patient also reports dizziness 3 weeks ago, same day as epigastric sensation, no associated chest pain/sob/palpitations, no symptoms since.   Patient is concerned for malignancy as she has two sisters who had similar symptoms and joint pain and were diagnosed with cancer   Review of Systems  Constitutional: Negative for fever, chills and fatigue.  Respiratory: Negative for cough, choking and shortness of breath.   Cardiovascular: Negative for chest pain.  Gastrointestinal: Positive for abdominal pain. Negative for nausea and diarrhea.  Occasional constipation, last BM 2 days ago, has BM every 1-2 days     Objective:   Physical Exam Vitals: reviewed, orthostatics negative HEENT: normocephalic, PERRL, EOMI, bilateral TM's pearly grey, MMM, uvula midline, no anterior or posterior cervical lymphadenopathy Cardiac: RRR, S1 and S2 present, no murmur Resp: CTAB, normal effort Abd: soft, no tenderness, normal bowel sounds Neuro: A&O x3, CN 2-12 intact      Assessment & Plan:  GERD (gastroesophageal reflux disease) One day history of  intermittent epigastric/chest sharp pain after drinking water 3 weeks ago. One other episode 10 days ago in [place]. Currently asymptomatic. -atypical in nature, low clinical concern for cardiac etiology, consider EKG if persists -suspect related to GERD, start Zantac 150 mg BID (to take with meals), suspect previous intolerance to PPI was due to taking on empty stomach -Return precautions given   Dizziness One day history of dizziness 3 weeks ago. No further symptoms. No associated cardiac signs/symtpoms. -check TSH and CBC to rule out thyroid issue or anemia -Return precautions given  Reassurance that symptoms not likely to be related to malignancy. Patient had multiple MSK issues not addressed today.  Flu shot provided.

## 2015-03-08 NOTE — Patient Instructions (Addendum)
It was nice to see you today.  Stomach pain/swallowing pain - likely due to your acid reflux, please start Ranitidine 150 mg BID  Dizziness - I am uncertain what caused this, I would like to check your blood levels and thyroid

## 2015-03-09 ENCOUNTER — Encounter: Payer: Self-pay | Admitting: Family Medicine

## 2015-06-21 ENCOUNTER — Other Ambulatory Visit: Payer: Self-pay

## 2015-06-21 DIAGNOSIS — Z1231 Encounter for screening mammogram for malignant neoplasm of breast: Secondary | ICD-10-CM

## 2015-07-18 ENCOUNTER — Ambulatory Visit
Admission: RE | Admit: 2015-07-18 | Discharge: 2015-07-18 | Disposition: A | Discharge status: Home / Self Care | Payer: BLUE CROSS/BLUE SHIELD | Source: Ambulatory Visit

## 2015-07-18 DIAGNOSIS — Z1231 Encounter for screening mammogram for malignant neoplasm of breast: Secondary | ICD-10-CM

## 2015-09-06 ENCOUNTER — Ambulatory Visit: Payer: BLUE CROSS/BLUE SHIELD | Admitting: Family Medicine

## 2015-10-04 ENCOUNTER — Ambulatory Visit (INDEPENDENT_AMBULATORY_CARE_PROVIDER_SITE_OTHER): Payer: BLUE CROSS/BLUE SHIELD | Admitting: Family Medicine

## 2015-10-04 ENCOUNTER — Encounter: Payer: Self-pay | Admitting: Family Medicine

## 2015-10-04 VITALS — BP 112/76 | HR 67 | Temp 97.6°F | Ht <= 58 in | Wt 104.0 lb

## 2015-10-04 DIAGNOSIS — N951 Menopausal and female climacteric states: Secondary | ICD-10-CM | POA: Diagnosis not present

## 2015-10-04 DIAGNOSIS — H6983 Other specified disorders of Eustachian tube, bilateral: Secondary | ICD-10-CM | POA: Diagnosis not present

## 2015-10-04 DIAGNOSIS — K219 Gastro-esophageal reflux disease without esophagitis: Secondary | ICD-10-CM

## 2015-10-04 DIAGNOSIS — Z0000 Encounter for general adult medical examination without abnormal findings: Secondary | ICD-10-CM | POA: Diagnosis not present

## 2015-10-04 DIAGNOSIS — H6980 Other specified disorders of Eustachian tube, unspecified ear: Secondary | ICD-10-CM | POA: Insufficient documentation

## 2015-10-04 DIAGNOSIS — H539 Unspecified visual disturbance: Secondary | ICD-10-CM | POA: Insufficient documentation

## 2015-10-04 LAB — COMPLETE METABOLIC PANEL WITH GFR
ALBUMIN: 4.3 g/dL (ref 3.6–5.1)
ALT: 14 U/L (ref 6–29)
AST: 19 U/L (ref 10–35)
Alkaline Phosphatase: 65 U/L (ref 33–130)
BUN: 12 mg/dL (ref 7–25)
CALCIUM: 9.4 mg/dL (ref 8.6–10.4)
CHLORIDE: 102 mmol/L (ref 98–110)
CO2: 29 mmol/L (ref 20–31)
CREATININE: 0.72 mg/dL (ref 0.50–1.05)
GFR, Est African American: >89 mL/min (ref >=60)
GFR, Est Non African American: >89 mL/min (ref >=60)
Glucose, Bld: 90 mg/dL (ref 65–99)
Potassium: 4 mmol/L (ref 3.5–5.3)
Sodium: 138 mmol/L (ref 135–146)
Total Bilirubin: 0.7 mg/dL (ref 0.2–1.2)
Total Protein: 7.1 g/dL (ref 6.1–8.1)

## 2015-10-04 LAB — CBC
HEMATOCRIT: 39 % (ref 35.0–45.0)
HEMOGLOBIN: 12.9 g/dL (ref 11.7–15.5)
MCH: 30.7 pg (ref 27.0–33.0)
MCHC: 33.1 g/dL (ref 32.0–36.0)
MCV: 92.9 fL (ref 80.0–100.0)
MPV: 10.8 fL (ref 7.5–12.5)
Platelets: 258 10*3/uL (ref 140–400)
RBC: 4.2 MIL/uL (ref 3.80–5.10)
RDW: 12.7 % (ref 11.0–15.0)
WBC: 4.6 10*3/uL (ref 3.8–10.8)

## 2015-10-04 LAB — LIPID PANEL
CHOLESTEROL: 226 mg/dL — AB (ref 125–200)
HDL: 81 mg/dL (ref >=46)
LDL CALC: 113 mg/dL (ref <130)
Total CHOL/HDL Ratio: 2.8 Ratio (ref <=5.0)
Triglycerides: 161 mg/dL — ABNORMAL HIGH (ref <150)
VLDL: 32 mg/dL — AB (ref <30)

## 2015-10-04 MED ORDER — RANITIDINE HCL 150 MG PO TABS: 150.0000 mg | ORAL_TABLET | Freq: Two times a day (BID) | ORAL | Status: DC

## 2015-10-04 NOTE — Assessment & Plan Note (Signed)
Patient reports singe occurrence of vision change. No current symptoms. Recent eye exam unremarkable. Eye exam unremarkable today except for small left medial pterygium. -encouraged follow up with ophthalmologist if symptoms persist.

## 2015-10-04 NOTE — Assessment & Plan Note (Signed)
50 y/o female presents for annual preventative visit. -immunizations up to date -up to date on mammogram/pap smear/colonoscopy -discussed regular exercise and healthy diet -provided form on living will/poa

## 2015-10-04 NOTE — Progress Notes (Signed)
50 y.o. year old female presents for well woman/preventative visit and annual GYN examination.  Acute Concerns: 1. GERD - taking Zantac twice per day (sometimes only once per day), acid reflux symtpoms improved with medication (can have symptoms when not taking).   2. Ear fullness - intermittent, initially called "ringing" but clarified that more of a "blockage".   3. Blurry vision - last week, resolved, has recent eye exam that was normal, left ptygium, symptoms with lack of sleep  Diet: 1-2 fruits per day, very few vegetables (does eat in soup), no sweetened beverages/soda  Exercise: No regular exercise  Sexual/Birth History: Not sexually active with boyfriend, in menopause (2015), reports vaginal dryness and pain with intercourse, has not attempted an otc lubricants, seen by OB/GYn (on [address]).   Birth Control: Not currently on birth control  POA/Living Will: No POA/Living Will.   Social:  Social History   Social History  . Marital Status: Single    Spouse Name: N/A  . Number of Children: N/A  . Years of Education: N/A   Social History Main Topics  . Smoking status: Never Smoker   . Smokeless tobacco: Never Used  . Alcohol Use: No  . Drug Use: No  . Sexual Activity: Not Asked   Other Topics Concern  . None   Social History Narrative    Immunization: Immunization History  Administered Date(s) Administered  . Tdap 05/30/2014    Cancer Screening:  Pap Smear: 03/2014  Mammogram: 07/18/15  Colonoscopy: 07/24/14  Dexa: Not a candidate    Physical Exam: VITALS: Reviewed GEN: Pleasant female, NAD HEENT: Normocephalic, PERRL, left eye pthyrgium, EOMI, no scleral icterus, bilateral TM pearly grey, nasal septum midline, MMM, uvula midline, no anterior or posterior lymphadenopathy, no thyromegaly CARDIAC:RRR, S1 and S2 present, no murmur, no heaves/thrills RESP: CTAB, normal effort BREAST: Deferred to OB/GYN ABD: soft, no tenderness, normal bowel  sounds GU/GYN:Deferred to OB/GYN. EXT: No edema, 2+ radial and DP pulses SKIN: no rash  ASSESSMENT & PLAN: 50 y.o. female presents for annual preventative exam. GYN care provided by outside provider. Please see problem specific assessment and plan.   Health maintenance examination 50 y/o female presents for annual preventative visit. -immunizations up to date -up to date on mammogram/pap smear/colonoscopy -discussed regular exercise and healthy diet -provided form on living will/poa  Eustachian tube dysfunction Bilateral ear symptoms consistent with ETD.  -Take Loratadine daily  GERD (gastroesophageal reflux disease) Symptoms controlled with Xantac 150 mg 1-2 times per day. -attempt trial off medication  Menopausal vaginal dryness Patient reports vaginal dryness that interferes with intercourse. Exam deferred as has regular follow up with GYN. -send for records to see most recent exam -encouraged use of vaginal lubricants  Change in vision Patient reports singe occurrence of vision change. No current symptoms. Recent eye exam unremarkable. Eye exam unremarkable today except for small left medial pterygium. -encouraged follow up with ophthalmologist if symptoms persist.

## 2015-10-04 NOTE — Assessment & Plan Note (Signed)
Symptoms controlled with Xantac 150 mg 1-2 times per day. -attempt trial off medication

## 2015-10-04 NOTE — Patient Instructions (Addendum)
It was nice to see you today.  Acid Reflux - you can attempt a trial of not taking the medication, if your symptoms return please restart the medication.   I will send for records from you GYN physician.   Please discuss your vaginal dryness/painful intercourse with your OB/GYN.  [employer] - [phone]  Labs - I will call you with the results.

## 2015-10-04 NOTE — Assessment & Plan Note (Signed)
Patient reports vaginal dryness that interferes with intercourse. Exam deferred as has regular follow up with GYN. -send for records to see most recent exam -encouraged use of vaginal lubricants

## 2015-10-04 NOTE — Assessment & Plan Note (Signed)
Bilateral ear symptoms consistent with ETD.  -Take Loratadine daily

## 2015-10-05 LAB — HEPATITIS C ANTIBODY: HCV Ab: NEGATIVE

## 2015-10-05 LAB — HIV ANTIBODY (ROUTINE TESTING W REFLEX): HIV 1&2 Ab, 4th Generation: NONREACTIVE

## 2015-10-09 ENCOUNTER — Encounter: Payer: Self-pay | Admitting: Family Medicine

## 2015-10-21 LAB — IFOBT (OCCULT BLOOD): IFOBT: POSITIVE

## 2015-10-22 ENCOUNTER — Encounter: Payer: Self-pay | Admitting: Family Medicine

## 2015-10-22 DIAGNOSIS — N841 Polyp of cervix uteri: Secondary | ICD-10-CM | POA: Insufficient documentation

## 2015-10-22 NOTE — Progress Notes (Addendum)
Reviewed records from Dr. Orpah Greek (OB/GYN). Updated EPIC when appropriate.   10/13/13 pelvic US - previous LSO, retroverted uterus, no myoma, normal right ovary  Last Exam 10/09/15  Pap Smears 09/2013 negative 08/2012 negative 06/2011 negative 05/2007 negative 05/2006 negative  Endometrial Biopsy 10/16/2014 - atrophic endometrium with stromal collapse, benign endocervical epithelium and mucoid material. No endometrial hyperplasia/atypia/cervical dysplasia.   Records scanned into EPIC.

## 2015-10-30 ENCOUNTER — Telehealth: Payer: Self-pay | Admitting: Family Medicine

## 2015-10-30 NOTE — Telephone Encounter (Signed)
Patient called front office stating that she needed to talk to me in regards to a test completed at her OB/GYN. I called patient back using [place]. Per patient she had abnormal stool study. Her OB/GYN stated that I (patient's PCP) needed to call their office to get the results. I informed the patient that I will call their office.   Called Dr. Marijean Heath office. They needed fax number to sent results. Provided fax number.

## 2015-11-05 NOTE — Telephone Encounter (Signed)
Opened note to route to Conservation officer, historic buildings.

## 2015-11-05 NOTE — Telephone Encounter (Signed)
Received records from OB/GYN Dr. Ulanda Edison. FOBT positive for blood. Reviewed old records. Colonoscopy in 2016 was completely negative. Patient reports occasional difficulty passing stools. Also has history of GERD. Hemoglobin normal in 09/2015. Will have RN staff schedule follow up to evaluate for fissures/hemorrhoids.  RN staff - please schedule follow up, thanks.

## 2015-11-06 NOTE — Telephone Encounter (Signed)
Pt scheduled for an appt. Deseree Blount, CMA  

## 2015-11-29 ENCOUNTER — Ambulatory Visit (INDEPENDENT_AMBULATORY_CARE_PROVIDER_SITE_OTHER): Payer: BLUE CROSS/BLUE SHIELD | Admitting: Family Medicine

## 2015-11-29 ENCOUNTER — Encounter: Payer: Self-pay | Admitting: Family Medicine

## 2015-11-29 ENCOUNTER — Ambulatory Visit: Payer: BLUE CROSS/BLUE SHIELD | Admitting: Family Medicine

## 2015-11-29 VITALS — BP 118/56 | HR 62 | Temp 98.2°F | Ht <= 58 in | Wt 102.8 lb

## 2015-11-29 DIAGNOSIS — K5900 Constipation, unspecified: Secondary | ICD-10-CM | POA: Diagnosis not present

## 2015-11-29 DIAGNOSIS — R10A2 Flank pain, left side: Secondary | ICD-10-CM

## 2015-11-29 DIAGNOSIS — K921 Melena: Secondary | ICD-10-CM | POA: Diagnosis not present

## 2015-11-29 DIAGNOSIS — R109 Unspecified abdominal pain: Secondary | ICD-10-CM

## 2015-11-29 HISTORY — DX: Unspecified abdominal pain: R10.9

## 2015-11-29 HISTORY — DX: Flank pain, left side: R10.A2

## 2015-11-29 LAB — CBC WITH DIFFERENTIAL/PLATELET
Basophils Absolute: 53 cells/uL (ref 0–200)
Basophils Relative: 1 %
EOS PCT: 4 %
Eosinophils Absolute: 212 cells/uL (ref 15–500)
HCT: 41.3 % (ref 35.0–45.0)
HEMOGLOBIN: 14 g/dL (ref 11.7–15.5)
LYMPHS ABS: 2491 {cells}/uL (ref 850–3900)
Lymphocytes Relative: 47 %
MCH: 31.3 pg (ref 27.0–33.0)
MCHC: 33.9 g/dL (ref 32.0–36.0)
MCV: 92.4 fL (ref 80.0–100.0)
MPV: 10.9 fL (ref 7.5–12.5)
Monocytes Absolute: 424 cells/uL (ref 200–950)
Monocytes Relative: 8 %
NEUTROS ABS: 2120 {cells}/uL (ref 1500–7800)
Neutrophils Relative %: 40 %
PLATELETS: 248 10*3/uL (ref 140–400)
RBC: 4.47 MIL/uL (ref 3.80–5.10)
RDW: 12.5 % (ref 11.0–15.0)
WBC: 5.3 10*3/uL (ref 3.8–10.8)

## 2015-11-29 LAB — POCT URINALYSIS DIPSTICK
Bilirubin, UA: NEGATIVE
Glucose, UA: NEGATIVE
Ketones, UA: NEGATIVE
LEUKOCYTES UA: NEGATIVE
Nitrite, UA: NEGATIVE
PH UA: 7.5
PROTEIN UA: NEGATIVE
SPEC GRAV UA: 1.015
UROBILINOGEN UA: 0.2

## 2015-11-29 LAB — POCT UA - MICROSCOPIC ONLY

## 2015-11-29 LAB — HEMOCCULT GUIAC POC 1CARD (OFFICE): FECAL OCCULT BLD: NEGATIVE

## 2015-11-29 MED ORDER — POLYETHYLENE GLYCOL 3350 17 GM/SCOOP PO POWD: 17.0000 g | Freq: Every day | ORAL | Status: DC

## 2015-11-29 NOTE — Assessment & Plan Note (Signed)
Recent FOBT positive at GYN office. POC FOB test negative today. Suspect constipation as the cause. Colonoscopy in 2016 negative.  -trial of Miralax -repeat stool FOB cards in 2 weeks (provided today in office) -check CBC Patient also has GERD, if blood persists will consider referral to GI for EGD.

## 2015-11-29 NOTE — Progress Notes (Signed)
   Subjective:    Patient ID: [name], female    DOB: 1965-12-24, 50 y.o.   MRN: [id number]  HPI 50 y/o female presents for follow up of positive FOBT at OB/GYN.   Reports some LLQ abdominal pain and left lower back, started a few weeks ago, not constant (does not occur daily), bending down does make the pain worse, no associated leg pain/parastesias.   Constipation has occurred more frequently over the past few weeks, she does have straining with some BM's, patient denies blood on toilet paper or in the bowel, tolerating diet  Social - nonsmoker   Review of Systems  Gastrointestinal: Positive for constipation. Negative for nausea, vomiting and diarrhea.  Genitourinary: Negative for dysuria and hematuria.        Objective:   Physical Exam BP 118/56 mmHg  Pulse 62  Temp(Src) 98.2 F (36.8 C) (Oral)  Ht 4\' 10"  (1.473 m)  Wt 102 lb 12.8 oz (46.63 kg)  BMI 21.49 kg/m2  LMP 06/28/2012 Gen: pleasant [place] Female, NAD Cardiac: RRR, S1 and S2 present, no murmur Resp: CTAB, normal effort Abd: soft, no tenderness, normal bowel sounds MSK: lumbar spine - no midline or paraspinal tenderness, normal SLT bilaterally; Right hip - no tenderness over greater trochanter, no pain with FABER/FADIR Neuro: strength bilateral legs 5/5 to hip flexion/knee flexion/knee extension, sensation to light touch intact in lower extremities, normal gait Skin: no rash Rectal: chaperone present, no external fissures/hemorrhoids, no gross blood, normal tone, no internal hemorrhoids/masses, small amount of stool present however unable to get significant amount for FOB test (see below)  Colonoscopy 2016 - no polyps POC UA pending at time of discharge from office. FOB test negative in office today  US done by GYN in 2016 showed previous left salpingo-oophorectomy.      Assessment & Plan:  Left flank pain Patient has LLQ/left flank/left low back pain. DDx. Included constipation/nephrolithiasis/MSK  etiology. Patient has previous left salpingo-oophorectomy in 2009 which makes ovarian cause less likely.  -will start treatment for constipation (see problem specific assessment and plan) -check UA today for blood (if positive consider imaging to evaluate for kidney stone) -as no MSK symptoms on exam today will hold on NSAID's. -if symptoms persist would need to refer back to GYN for further workup    Constipation Patient having symptoms on constipation. -trial of Miralax   Blood in stool Recent FOBT positive at GYN office. POC FOB test negative today. Suspect constipation as the cause. Colonoscopy in 2016 negative.  -trial of Miralax -repeat stool FOB cards in 2 weeks (provided today in office) -check CBC Patient also has GERD, if blood persists will consider referral to GI for EGD.

## 2015-11-29 NOTE — Assessment & Plan Note (Addendum)
Patient has LLQ/left flank/left low back pain. DDx. Included constipation/nephrolithiasis/MSK etiology. Patient has previous left salpingo-oophorectomy in 2009 which makes ovarian cause less likely.  -will start treatment for constipation (see problem specific assessment and plan) -check UA today for blood (if positive consider imaging to evaluate for kidney stone) -as no MSK symptoms on exam today will hold on NSAID's. -if symptoms persist would need to refer back to GYN for further workup

## 2015-11-29 NOTE — Patient Instructions (Signed)
It was nice to meet you today.  Your rectal was normal. I think that your stomach pain and back pain is mostly related to your constipation. We will start Miralax.  We will check some labs and urine today.

## 2015-11-29 NOTE — Assessment & Plan Note (Signed)
Patient having symptoms on constipation. -trial of Miralax

## 2015-12-03 ENCOUNTER — Telehealth: Payer: Self-pay | Admitting: Family Medicine

## 2015-12-03 NOTE — Telephone Encounter (Signed)
Called patient and reviewed CBC and urine results. Rare RBC's in urine. Patient no longer has periods so this did not contribute to blood in urine. Patient reports improvement of abdominal/flank/back pain with Miralax. Will repeat urine later this week.   RN staff please call patient to set up lab appointment later this week to get POC urine.

## 2015-12-03 NOTE — Telephone Encounter (Signed)
Pt scheduled for a lab appt to leave urine. [name] Kennon Holter, CMA

## 2015-12-04 ENCOUNTER — Other Ambulatory Visit (INDEPENDENT_AMBULATORY_CARE_PROVIDER_SITE_OTHER): Payer: BLUE CROSS/BLUE SHIELD

## 2015-12-04 DIAGNOSIS — R319 Hematuria, unspecified: Secondary | ICD-10-CM | POA: Diagnosis not present

## 2015-12-04 LAB — POCT URINALYSIS DIPSTICK
BILIRUBIN UA: NEGATIVE
GLUCOSE UA: NEGATIVE
Ketones, UA: NEGATIVE
Leukocytes, UA: NEGATIVE
Nitrite, UA: NEGATIVE
PH UA: 7
Protein, UA: NEGATIVE
SPEC GRAV UA: 1.015
Urobilinogen, UA: 0.2

## 2015-12-04 LAB — POCT UA - MICROSCOPIC ONLY

## 2015-12-05 ENCOUNTER — Telehealth: Payer: Self-pay | Admitting: Family Medicine

## 2015-12-05 DIAGNOSIS — R319 Hematuria, unspecified: Secondary | ICD-10-CM | POA: Insufficient documentation

## 2015-12-05 NOTE — Assessment & Plan Note (Signed)
Discussed urine results with patient using [place] interpretor. Very small amount of blood still present in urine. Will check non-contrast CT of abdomen/pelvis to evaluate for kidney stone/mass.

## 2015-12-05 NOTE — Telephone Encounter (Signed)
Discussed urine results with patient using [place] interpretor. Very small amount of blood still present in urine. Will check non-contrast CT of abdomen/pelvis to evaluate for kidney stone/mass.

## 2015-12-07 ENCOUNTER — Telehealth: Payer: Self-pay | Admitting: *Deleted

## 2015-12-07 NOTE — Telephone Encounter (Signed)
Patient was calling to find out appointment for patient's CT scan.  Will forward to red team to schedule and call patient with an interpreter with date and time. Floree Zuniga,CMA

## 2015-12-11 NOTE — Telephone Encounter (Signed)
Appointment scheduled at [facility] for 8/3 at 8am, NPO after midnight, patient informed.

## 2015-12-13 ENCOUNTER — Ambulatory Visit (HOSPITAL_COMMUNITY)
Admission: RE | Admit: 2015-12-13 | Discharge: 2015-12-13 | Disposition: A | Discharge status: Home / Self Care | Payer: BLUE CROSS/BLUE SHIELD | Source: Ambulatory Visit | Attending: Family Medicine | Admitting: Family Medicine

## 2015-12-13 ENCOUNTER — Encounter (HOSPITAL_COMMUNITY): Payer: Self-pay

## 2015-12-13 DIAGNOSIS — I700 Atherosclerosis of aorta: Secondary | ICD-10-CM | POA: Diagnosis not present

## 2015-12-13 DIAGNOSIS — R319 Hematuria, unspecified: Secondary | ICD-10-CM

## 2015-12-18 ENCOUNTER — Telehealth: Payer: Self-pay | Admitting: Family Medicine

## 2015-12-18 DIAGNOSIS — N3289 Other specified disorders of bladder: Secondary | ICD-10-CM

## 2015-12-18 NOTE — Telephone Encounter (Signed)
Attempted to contact patient with CT results using [place] Interpret[employer], no answer, left message that I would call again at a later time.

## 2015-12-19 ENCOUNTER — Encounter: Payer: Self-pay | Admitting: Family Medicine

## 2015-12-19 DIAGNOSIS — N3289 Other specified disorders of bladder: Secondary | ICD-10-CM | POA: Insufficient documentation

## 2015-12-19 NOTE — Assessment & Plan Note (Signed)
Referral to Urology due to bladder wall thickening without UTI.

## 2015-12-19 NOTE — Telephone Encounter (Signed)
Attempted to contact patient using [place] [employer]. Left message that I wound send a letter with her results.

## 2015-12-20 ENCOUNTER — Encounter: Payer: Self-pay | Admitting: Family Medicine

## 2015-12-20 LAB — POC HEMOCCULT BLD/STL (HOME/3-CARD/SCREEN)
Card #2 Fecal Occult Blod, POC: NEGATIVE
Card #3 Fecal Occult Blood, POC: NEGATIVE
FECAL OCCULT BLD: NEGATIVE

## 2015-12-20 NOTE — Addendum Note (Signed)
Addended by: [place] Pink on: 12/20/2015 10:10 AM   Modules accepted: Orders

## 2016-01-16 ENCOUNTER — Encounter: Payer: Self-pay | Admitting: Family Medicine

## 2016-03-05 NOTE — Progress Notes (Signed)
   Subjective:    Patient ID: [name], female    DOB: [birth year], 50 y.o.   MRN: [id number]  HPI 49 y/o female presents for evaluation of chest pain. [place] Video interpretor not utilized as unable to connect to service.   Chest Pain Reports chest pain started approximately 2 weeks ago (however she has noted at previous visits for her mother that occurred earlier than 2 weeks). States experiencing a total of one or two episodes of chest pain at rest that last for about 2 seconds. Denies HA or dizziness associated with episodes. No radiation, not related to exertion, squeezing in nature. Patient does have GERD but symptoms are different than acid symptoms.   Left Flank Pain/Hx Hematuria Reports left flank pain two days ago. Denies hematuria, fevers, chills.  Has seen [facility] in the recent past after CT abdomen that identified thickened bladder. No stones on CT. Per patient Urology was not planning any further workup.   Reviewed [facility] Note from 02/21/16. Dx. Asymptomatic microscopic hematuria. Patient has less than 3 RBC's/HPF therefore no further workup planned Dx. Bladder wall thickening on CT Scan. Thought to be incidental finding as patient asymptomatic.   Review of Systems  Constitutional: Negative for fever.  Respiratory: Negative for shortness of breath.   Cardiovascular: Positive for chest pain.  Gastrointestinal: Positive for abdominal pain.        Objective:   Physical Exam BP (!) 111/37   Pulse 60   Temp 97.9 F (36.6 C) (Oral)   Ht 4\' 10"  (1.473 m)   Wt 107 lb (48.5 kg)   LMP 06/28/2012   BMI 22.36 kg/m  Gen: pleasant [place] female, NAD CV: Regular, rate, rhythm. No murmurs.  Pulm: Lungs CTAB, normal work of breathing Abd: Soft and non-tender. No rebounding or guarding. Normal bowel sounds Skin: no rashes  POC UA pending at time of discharge.  EKG: sinus bradycardia, no ischemic changes     Assessment & Plan:  Left flank  pain Recurrent left flank pain with hematuria. CT negative for nephrolithiasis. Recheck UA today. Recently seen by Urology.  -recheck UA today   Bladder wall thickening Found on CT scan in 12/2015. Evaluated by urology and thought to be incidental finding. Will monitor for symptoms.   Hematuria Recheck UA today.   Chest pain Atypical Chest Pain. Cardiac exam unremarkable. EKG showed sinus bradycardia. May be related to GERD. -will send to cardiology for further evaluation.

## 2016-03-06 ENCOUNTER — Ambulatory Visit (INDEPENDENT_AMBULATORY_CARE_PROVIDER_SITE_OTHER): Payer: BLUE CROSS/BLUE SHIELD | Admitting: Family Medicine

## 2016-03-06 ENCOUNTER — Encounter: Payer: Self-pay | Admitting: Family Medicine

## 2016-03-06 ENCOUNTER — Ambulatory Visit (HOSPITAL_COMMUNITY)
Admission: RE | Admit: 2016-03-06 | Discharge: 2016-03-06 | Disposition: A | Discharge status: Home / Self Care | Payer: BLUE CROSS/BLUE SHIELD | Source: Ambulatory Visit | Attending: Family Medicine | Admitting: Family Medicine

## 2016-03-06 VITALS — BP 111/37 | HR 60 | Temp 97.9°F | Ht <= 58 in | Wt 107.0 lb

## 2016-03-06 DIAGNOSIS — R319 Hematuria, unspecified: Secondary | ICD-10-CM

## 2016-03-06 DIAGNOSIS — N3289 Other specified disorders of bladder: Secondary | ICD-10-CM

## 2016-03-06 DIAGNOSIS — R001 Bradycardia, unspecified: Secondary | ICD-10-CM | POA: Insufficient documentation

## 2016-03-06 DIAGNOSIS — R079 Chest pain, unspecified: Secondary | ICD-10-CM

## 2016-03-06 DIAGNOSIS — K219 Gastro-esophageal reflux disease without esophagitis: Secondary | ICD-10-CM | POA: Diagnosis not present

## 2016-03-06 DIAGNOSIS — R109 Unspecified abdominal pain: Secondary | ICD-10-CM | POA: Diagnosis not present

## 2016-03-06 HISTORY — DX: Chest pain, unspecified: R07.9

## 2016-03-06 LAB — POCT URINALYSIS DIPSTICK
Bilirubin, UA: NEGATIVE
Glucose, UA: NEGATIVE
KETONES UA: NEGATIVE
Leukocytes, UA: NEGATIVE
Nitrite, UA: NEGATIVE
PH UA: 8
PROTEIN UA: NEGATIVE
RBC UA: NEGATIVE
SPEC GRAV UA: 1.015
UROBILINOGEN UA: 0.2

## 2016-03-06 MED ORDER — RANITIDINE HCL 150 MG PO TABS: 150.0000 mg | ORAL_TABLET | Freq: Two times a day (BID) | ORAL | 0 refills | Status: DC

## 2016-03-06 NOTE — Assessment & Plan Note (Signed)
Recheck UA today.  

## 2016-03-06 NOTE — Assessment & Plan Note (Addendum)
Recurrent left flank pain with hematuria. CT negative for nephrolithiasis. Recheck UA today. Recently seen by Urology.  -recheck UA today

## 2016-03-06 NOTE — Assessment & Plan Note (Signed)
Atypical Chest Pain. Cardiac exam unremarkable. EKG showed sinus bradycardia. May be related to GERD. -will send to cardiology for further evaluation.

## 2016-03-06 NOTE — Assessment & Plan Note (Signed)
Found on CT scan in 12/2015. Evaluated by urology and thought to be incidental finding. Will monitor for symptoms.

## 2016-03-06 NOTE — Patient Instructions (Signed)
It was nice to see you today.  Chest Pain - I have referred you to cardiology, my office will call to schedule you an appointment.  Left side pain - I am uncertain what is causing this, your CT scan was unremarkable, treat conservatively with heat/ice as needed

## 2016-03-11 ENCOUNTER — Ambulatory Visit: Payer: BLUE CROSS/BLUE SHIELD

## 2016-03-14 ENCOUNTER — Ambulatory Visit (INDEPENDENT_AMBULATORY_CARE_PROVIDER_SITE_OTHER): Payer: BLUE CROSS/BLUE SHIELD | Admitting: *Deleted

## 2016-03-14 DIAGNOSIS — Z23 Encounter for immunization: Secondary | ICD-10-CM | POA: Diagnosis not present

## 2016-04-08 ENCOUNTER — Encounter (INDEPENDENT_AMBULATORY_CARE_PROVIDER_SITE_OTHER): Payer: Self-pay

## 2016-04-08 ENCOUNTER — Ambulatory Visit (INDEPENDENT_AMBULATORY_CARE_PROVIDER_SITE_OTHER): Payer: BLUE CROSS/BLUE SHIELD | Admitting: Cardiology

## 2016-04-08 ENCOUNTER — Encounter: Payer: Self-pay | Admitting: Cardiology

## 2016-04-08 VITALS — BP 124/82 | HR 62 | Ht <= 58 in | Wt 105.0 lb

## 2016-04-08 DIAGNOSIS — R0789 Other chest pain: Secondary | ICD-10-CM | POA: Diagnosis not present

## 2016-04-08 DIAGNOSIS — K219 Gastro-esophageal reflux disease without esophagitis: Secondary | ICD-10-CM

## 2016-04-08 NOTE — Progress Notes (Signed)
Cardiology Office Note    Date:  04/08/2016   ID:  [name], [place] 1966/05/12, MRN [id number]  PCP:  Lupita Dawn, MD  Cardiologist:   Candee Furbish, MD     History of Present Illness:  [name] is a 50 y.o. female here for the evaluation of chest pain at the request of Dr. Ree Kida.   She has had 2-3 episodes of central chest pain at rest, not with exertion, with no SOB, no diaphoresis, no syncope. Short, 2-3 second duration. Sharp and sudden has to bend over. Drink water and goes away. Sometimes seem to have trouble getting food down upper throat she says. Sometimes burn when laying down. Has GERD.  No DM, no HTN, no HL, no tob, no early family history of CAD. 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     Past Medical History:  Diagnosis Date  . Allergy   . GERD (gastroesophageal reflux disease)   . Hyperlipidemia    BORDERLINE    Past Surgical History:  Procedure Laterality Date  . CESAREAN SECTION  07/1999   x1  . OOPHORECTOMY Left 2009   Left Ovarian Mass    Current Medications: Outpatient Medications Prior to Visit  Medication Sig Dispense Refill  . LORATADINE PO Take by mouth as needed.    Marland Kitchen OVER THE COUNTER MEDICATION Take 1 each by mouth daily. HAIR SKIN NAILS GUMMIES    . OVER THE COUNTER MEDICATION Take 1 tablet by mouth daily. CIRCULYN EXTRACT    . OVER THE COUNTER MEDICATION Take 1 capsule by mouth daily. SAFFLOWER EXTRACT    . polyethylene glycol powder (GLYCOLAX/MIRALAX) powder Take 17 g by mouth daily. Mix with 4-8 ounces of water. 3350 g 1  . ranitidine (ZANTAC) 150 MG tablet Take 1 tablet (150 mg total) by mouth 2 (two) times daily. 180 tablet 0   No facility-administered medications prior to visit.      Allergies:   Beef-derived products   Social History   Social History  . Marital status: Single    Spouse name: N/A  . Number of children: N/A  . Years of education: N/A   Social History Main Topics  . Smoking status: Never Smoker  . Smokeless  tobacco: Never Used  . Alcohol use No  . Drug use: No  . Sexual activity: Not Asked   Other Topics Concern  . None   Social History Narrative  . None     Family History:  No early FHX of CAD   ROS:   Please see the history of present illness.    ROS All other systems reviewed and are negative.   PHYSICAL EXAM:   VS:  BP 124/82   Pulse 62   Ht 4\' 10"  (1.473 m)   Wt 105 lb (47.6 kg)   LMP 06/28/2012   BMI 21.95 kg/m    GEN: Well nourished, well developed, in no acute distress  HEENT: normal  Neck: no JVD, carotid bruits, or masses Cardiac: RRR; no murmurs, rubs, or gallops,no edema  Respiratory:  clear to auscultation bilaterally, normal work of breathing GI: soft, nontender, nondistended, + BS MS: no deformity or atrophy  Skin: warm and dry, no rash Neuro:  Alert and Oriented x 3, Strength and sensation are intact Psych: euthymic mood, full affect  Wt Readings from Last 3 Encounters:  04/08/16 105 lb (47.6 kg)  03/06/16 107 lb (48.5 kg)  11/29/15 102 lb 12.8 oz (46.6 kg)  Studies/Labs Reviewed:   EKG:  From 03/06/16 - Sinus bradycardia 53, no ST changes. Personally viewed.   Recent Labs: 10/04/2015: ALT 14; BUN 12; Creat 0.72; Potassium 4.0; Sodium 138 11/29/2015: Hemoglobin 14.0; Platelets 248   Lipid Panel    Component Value Date/Time   CHOL 226 (H) 10/04/2015 0930   TRIG 161 (H) 10/04/2015 0930   HDL 81 10/04/2015 0930   CHOLHDL 2.8 10/04/2015 0930   VLDL 32 (H) 10/04/2015 0930   LDLCALC 113 10/04/2015 0930    Additional studies/ records that were reviewed today include:  Prior office notes, ECG, labs reviewed.    ASSESSMENT:    1. Atypical chest pain   2. Gastroesophageal reflux disease without esophagitis      PLAN:  In order of problems listed above:  Chest pain  - atypical, rest, short lived, non exertional  - Etiologies include GERD, MSK, Possibly esophageal spasm. Thankfully the symptoms are quite rare  - Given age, will  proceed with ETT. EKG is interpretable, she is able to walk.  - Could consider calcium channel blocker such as diltiazem for possible esophageal spasm however right now would encourage conservative management given the rarity of symptoms.   GERD  - Continue with ranitidine as previously prescribed  - She does describe dysphagia intermittently. Continue to monitor this.    Medication Adjustments/Labs and Tests Ordered: Current medicines are reviewed at length with the patient today.  Concerns regarding medicines are outlined above.  Medication changes, Labs and Tests ordered today are listed in the Patient Instructions below. Patient Instructions  Medication Instructions:  The current medical regimen is effective;  continue present plan and medications.  Testing/Procedures: Your physician has requested that you have an exercise tolerance test. For further information please visit [email or web address]. Please also follow instruction sheet, as given.  Follow-Up: Follow up as needed after your treadmill test.  Thank you for choosing [facility]!!          Signed, Candee Furbish, MD  04/08/2016 8:45 AM    [facility] [address], [place], [place]  [address] Phone: [phone]; Fax: [phone]

## 2016-04-08 NOTE — Patient Instructions (Signed)
Medication Instructions:  The current medical regimen is effective;  continue present plan and medications.  Testing/Procedures: Your physician has requested that you have an exercise tolerance test. For further information please visit [email or web address]. Please also follow instruction sheet, as given.  Follow-Up: Follow up as needed after your treadmill test.  Thank you for choosing [facility]!!

## 2016-04-18 ENCOUNTER — Ambulatory Visit (INDEPENDENT_AMBULATORY_CARE_PROVIDER_SITE_OTHER): Payer: BLUE CROSS/BLUE SHIELD

## 2016-04-18 DIAGNOSIS — R0789 Other chest pain: Secondary | ICD-10-CM

## 2016-04-18 LAB — EXERCISE TOLERANCE TEST
CHL CUP MPHR: 168 {beats}/min
CHL CUP RESTING HR STRESS: 59 {beats}/min
CHL CUP STRESS STAGE 1 DBP: 71 mmHg
CHL CUP STRESS STAGE 1 HR: 61 {beats}/min
CHL CUP STRESS STAGE 1 SBP: 135 mmHg
CHL CUP STRESS STAGE 2 GRADE: 0 %
CHL CUP STRESS STAGE 3 GRADE: 0 %
CHL CUP STRESS STAGE 3 HR: 61 {beats}/min
CHL CUP STRESS STAGE 3 SPEED: 1 mph
CHL CUP STRESS STAGE 4 GRADE: 0.1 %
CHL CUP STRESS STAGE 4 HR: 61 {beats}/min
CHL CUP STRESS STAGE 5 HR: 99 {beats}/min
CHL CUP STRESS STAGE 6 DBP: 78 mmHg
CHL CUP STRESS STAGE 7 SPEED: 3.4 mph
CHL CUP STRESS STAGE 8 DBP: 71 mmHg
CHL CUP STRESS STAGE 8 HR: 116 {beats}/min
CHL CUP STRESS STAGE 8 SBP: 144 mmHg
CHL CUP STRESS STAGE 8 SPEED: 0 mph
CHL CUP STRESS STAGE 9 HR: 73 {beats}/min
CHL CUP STRESS STAGE 9 SBP: 120 mmHg
CHL RATE OF PERCEIVED EXERTION: 16
CSEPED: 8 min
CSEPEDS: 0 s
CSEPHR: 91 %
CSEPPHR: 153 {beats}/min
CSEPPMHR: 91 %
Estimated workload: 10.1 METS
Stage 1 Grade: 0 %
Stage 1 Speed: 0 mph
Stage 2 HR: 62 {beats}/min
Stage 2 Speed: 0 mph
Stage 4 Speed: 1 mph
Stage 5 DBP: 79 mmHg
Stage 5 Grade: 10 %
Stage 5 SBP: 147 mmHg
Stage 5 Speed: 1.7 mph
Stage 6 Grade: 12 %
Stage 6 HR: 111 {beats}/min
Stage 6 SBP: 166 mmHg
Stage 6 Speed: 2.5 mph
Stage 7 Grade: 14 %
Stage 7 HR: 153 {beats}/min
Stage 8 Grade: 0 %
Stage 9 DBP: 70 mmHg
Stage 9 Grade: 0 %
Stage 9 Speed: 0 mph

## 2016-04-23 NOTE — Progress Notes (Signed)
   Subjective:    Patient ID: [name], female    DOB: 06/14/1965, 50 y.o.   MRN: [id number]  HPI 50 y/o [place] female presents for routine follow up. [place] interpretor present (cut out during middle of exam). Able to communicate in [place].   Abdominal Pain Epigastric radiating to the left side and up into chest, sometimes burning and sometimes sharp, present for a few weeks, sometimes radiates to back, most days of the week, sometimes worse with eating, occasinal nausea, no emesis, reports constipation (last BM was this AM, small, hard). Takes 1-2 tablets Zantac daily, uses Miralax only occasionally. No blood in stool.   Chest Pain Seen by cardiology on 04/08/16. Had Exercise Tolerance Test on 04/18/16 (see results below)  GERD Currently on Ranitidine, could be contributing to chest pain symptoms.   Numbness in right leg Few weeks, no injury, associated low back pain at times, no bladder or bowel incontinence, she is concerned that her symptoms may be something more serious such as cancer as her sister was recently diagnosed with what sounds in lung cancer.   HM Up to date  Social - recnetly diagnosed with lung cancer  Review of Systems See HPI Also reports a few episodes of "chills from inside her body"    Objective:   Physical Exam BP 124/74   Pulse 60   Temp 97.6 F (36.4 C) (Oral)   Ht 4\' 10"  (1.473 m)   Wt 105 lb 3.2 oz (47.7 kg)   LMP 06/28/2012   SpO2 99%   BMI 21.99 kg/m    Gen: pleasant female, NAD HEENT: normocephalic, PERRL, EOMI, no scleral icterus, MMM, neck supple Cardiac: RRR, S1 and S2 present, no murmur Resp: CTAB, normal effort, no cough Abd: soft, epigastric tenderness, normal bowel sounds, no mass Ext: no edema Neuro: CN 2-12 intact, strength 5/5 in all extremities, sensation to light touch intact in all extremities (slightly diminished diffusely in RLE), negative pronator drift, able to perform finger to nose bilaterally, negative  rhomberg, no clonus bilaterally, 2+ patellar reflexes bilaterally   ETT on 12/8 Blood pressure demonstrated a normal response to exercise.There was no ST segment deviation noted during stress.Normal exercise tolerance test with no ishcemia at an adequate heart rate response of 91% MPHR and good workload of 10.1 mets.This is a low risk study.  CT Abd/Pelvis (12/13/15) No acute abnormality other than slightly thickened bladder (previous workup outlined in epic)    Assessment & Plan:  Abdominal pain, chronic, epigastric Patient presents for evaluation of abdominal pain (epigastric that radiates to chest and left side). I think this is most likely GERD/PUD with associated constipation.  See problem specific plans  GERD (gastroesophageal reflux disease) Uncontrolled.  -stop Zantac -start Nexium  Constipation Uncontrolled -counseled patient to take Miralax daily, may increase to twice daily as needed  Right leg numbness Few week history of right leg numbness. Neuro exam unremarkable other than subjective decreased sensation to light touch. No red flag signs/symptoms of [place]/Infection. I also suspect a psychgenic component as her sister had similar symptoms related to new diagnosis of lung cancer -trial of heat to back. -if persists or worsens will check imaging of lumbar spine

## 2016-04-24 ENCOUNTER — Encounter: Payer: Self-pay | Admitting: Family Medicine

## 2016-04-24 ENCOUNTER — Ambulatory Visit (INDEPENDENT_AMBULATORY_CARE_PROVIDER_SITE_OTHER): Payer: BLUE CROSS/BLUE SHIELD | Admitting: Family Medicine

## 2016-04-24 DIAGNOSIS — R200 Anesthesia of skin: Secondary | ICD-10-CM

## 2016-04-24 DIAGNOSIS — K219 Gastro-esophageal reflux disease without esophagitis: Secondary | ICD-10-CM | POA: Diagnosis not present

## 2016-04-24 DIAGNOSIS — K5900 Constipation, unspecified: Secondary | ICD-10-CM | POA: Diagnosis not present

## 2016-04-24 DIAGNOSIS — R1013 Epigastric pain: Secondary | ICD-10-CM | POA: Diagnosis not present

## 2016-04-24 DIAGNOSIS — G8929 Other chronic pain: Secondary | ICD-10-CM

## 2016-04-24 MED ORDER — ESOMEPRAZOLE MAGNESIUM 40 MG PO CPDR: 40.0000 mg | DELAYED_RELEASE_CAPSULE | Freq: Every day | ORAL | 3 refills | Status: DC

## 2016-04-24 MED ORDER — POLYETHYLENE GLYCOL 3350 17 GM/SCOOP PO POWD: 17.0000 g | Freq: Every day | ORAL | 1 refills | Status: DC

## 2016-04-24 NOTE — Assessment & Plan Note (Signed)
Patient presents for evaluation of abdominal pain (epigastric that radiates to chest and left side). I think this is most likely GERD/PUD with associated constipation.  See problem specific plans

## 2016-04-24 NOTE — Addendum Note (Signed)
Addended by: Lupita Dawn on: 04/24/2016 10:36 AM   Modules accepted: Orders

## 2016-04-24 NOTE — Assessment & Plan Note (Signed)
Uncontrolled -counseled patient to take Miralax daily, may increase to twice daily as needed

## 2016-04-24 NOTE — Assessment & Plan Note (Signed)
Few week history of right leg numbness. Neuro exam unremarkable other than subjective decreased sensation to light touch. No red flag signs/symptoms of Cauda Equina/Infection. I also suspect a psychgenic component as her sister had similar symptoms related to new diagnosis of lung cancer -trial of heat to back. -if persists or worsens will check imaging of lumbar spine

## 2016-04-24 NOTE — Assessment & Plan Note (Signed)
Uncontrolled.  -stop Zantac -start Nexium

## 2016-04-24 NOTE — Patient Instructions (Addendum)
It was nice to see you today.  Stomach pain - you may have ulcers, start Nexium 40 mg daily; If your pain does not improve we will send you to a stomach doctor.   Constipation - take Miralax daily, may take twice daily if you do not have soft stools  Leg numbness - trial of heat to the back, return if the symptoms get worse.  Your lungs are clear today.

## 2016-05-27 NOTE — Progress Notes (Deleted)
   Subjective:    Patient ID: [name], female    DOB: 1965/07/26, 51 y.o.   MRN: [id number]  HPI 51 y/o [place] female presents for  follow up up abdominal pain and right leg numbness. Last evaluated for these symptoms on 04/24/16.   Abdominal Pain (epigastric)/GERD Transitioned from Zantac to Nexium at last visit.   Right Leg numbness Started in November 2017, some associated low back pain, told to attempt trial of heat to back at least appointment  Bladder wall thickening on CT 12/2015   Review of Systems     Objective:   Physical Exam LMP 06/28/2012    Reviewed CT from 12/2015  UA 02/2016 negative Rare RBC on UA 11/2015    Assessment & Plan:  No problem-specific Assessment & Plan notes found for this encounter.

## 2016-05-29 ENCOUNTER — Ambulatory Visit: Payer: BLUE CROSS/BLUE SHIELD | Admitting: Family Medicine

## 2016-06-06 ENCOUNTER — Ambulatory Visit (INDEPENDENT_AMBULATORY_CARE_PROVIDER_SITE_OTHER): Payer: BLUE CROSS/BLUE SHIELD | Admitting: Family Medicine

## 2016-06-06 ENCOUNTER — Encounter: Payer: Self-pay | Admitting: Family Medicine

## 2016-06-06 DIAGNOSIS — B349 Viral infection, unspecified: Secondary | ICD-10-CM | POA: Diagnosis not present

## 2016-06-06 DIAGNOSIS — R1013 Epigastric pain: Secondary | ICD-10-CM | POA: Diagnosis not present

## 2016-06-06 DIAGNOSIS — K5900 Constipation, unspecified: Secondary | ICD-10-CM | POA: Diagnosis not present

## 2016-06-06 DIAGNOSIS — G8929 Other chronic pain: Secondary | ICD-10-CM

## 2016-06-06 DIAGNOSIS — R200 Anesthesia of skin: Secondary | ICD-10-CM

## 2016-06-06 DIAGNOSIS — K219 Gastro-esophageal reflux disease without esophagitis: Secondary | ICD-10-CM

## 2016-06-06 LAB — FOLATE: Folate: 17 ng/mL (ref >5.4)

## 2016-06-06 LAB — VITAMIN B12: VITAMIN B 12: 966 pg/mL (ref 200–1100)

## 2016-06-06 LAB — TSH: TSH: 2.45 m[IU]/L

## 2016-06-06 MED ORDER — BENZONATATE 100 MG PO CAPS: 100.0000 mg | ORAL_CAPSULE | Freq: Three times a day (TID) | ORAL | 1 refills | Status: DC | PRN

## 2016-06-06 MED ORDER — ESOMEPRAZOLE MAGNESIUM 40 MG PO CPDR: 40.0000 mg | DELAYED_RELEASE_CAPSULE | Freq: Every day | ORAL | 3 refills | Status: DC

## 2016-06-06 NOTE — Assessment & Plan Note (Signed)
Signs/symptoms of viral illness. -counseled patient to start Mucinex -prescription for Ladona Ridgel sent to pharmacy.

## 2016-06-06 NOTE — Assessment & Plan Note (Addendum)
Improved with Nexium.  -Given history of reported esophageal/stomach inflammation on EGD in [place] will refer to GI for further evaluation.  -check CMP and Lipase

## 2016-06-06 NOTE — Assessment & Plan Note (Signed)
Now with bilateral (R>L) toe numbness. Associated occasional lumbar back pain with +SLT today.  -check MRI spine to rule out lumbar spine etiology -check TSH/B12/Folate for cause of neuropathic pain

## 2016-06-06 NOTE — Progress Notes (Signed)
   Subjective:    Patient ID: [name], female    DOB: 1966/03/05, 51 y.o.   MRN: [id number]  HPI 51 y/o female presents for follow up of abdominal pain and right leg numbness. History obtained using [place] interpretor.  GERD/Abdominal Pain/Constipation Started on Nexium last visit. Reflux is improved when taking. Also told to take Miralax daily for constipation. Reports softer stools. Has BM almost every day.   Still reports epigastric/chest pain that last for 1-2 minutes, does also have some RUQ soreness. She had and EGD in [place] last year (reports that she had inflammation of her esophagus/stomach)  Cough/congestion 10 day history of cough and congestion, no sore throat, no fevers, has been taking otc Nyquil with little improvement of symptoms.   Bilateral Foot Numbness  Mostly in right toes but occasionally in left toes, no associated bladder/bowel incontinence, has occasional midline low back pain, no current pain  HM Up to date   Review of Systems  Constitutional: Negative for chills and fatigue.  Gastrointestinal: Positive for abdominal pain. Negative for blood in stool, constipation and diarrhea.       Objective:   Physical Exam BP 130/76   Pulse (!) 57   Temp 98.1 F (36.7 C) (Oral)   Ht 4\' 10"  (1.473 m)   Wt 105 lb 9.6 oz (47.9 kg)   LMP 06/28/2012   SpO2 100%   BMI 22.07 kg/m   Gen: pleasant [place] female, NAD Cardiac: RRR, S1 and S2 present, no murmur Resp: CTAB, normal effort Abd: soft, no tenderness, normal bowel sounds MSK: Lumbar Spine: no midline or paraspinal tenderness, SLT positive on right for radicular symtpoms Neuro: Strength 5/5 in bilateral lower extremities, sensation to light touch intact in LE, 2+ patellar reflexes bilaterally, no clonus Monofilament - bilateral toes (R>L) decreased sensation     Assessment & Plan:  Abdominal pain, chronic, epigastric Improved with Nexium.  -Given history of reported esophageal/stomach  inflammation on EGD in [place] will refer to GI for further evaluation.  -check CMP and Lipase  Constipation Improved with Miralax. Continue daily.   Right leg numbness Now with bilateral (R>L) toe numbness. Associated occasional lumbar back pain with +SLT today.  -check MRI spine to rule out lumbar spine etiology -check TSH/B12/Folate for cause of neuropathic pain  GERD (gastroesophageal reflux disease) Improved on Nexium  Viral illness Signs/symptoms of viral illness. -counseled patient to start Mucinex -prescription for Tessalon Perles sent to pharmacy.

## 2016-06-06 NOTE — Assessment & Plan Note (Signed)
Improved with Miralax. Continue daily.

## 2016-06-06 NOTE — Assessment & Plan Note (Signed)
Improved on Nexium

## 2016-06-06 NOTE — Patient Instructions (Signed)
It was nice to see you today.  Right foot numbness - check labs, will also schedule MRI of your back  Stomach Pain - continue Nexium, will refer you to a GI/Stomach doctor, my office will schedule for you  Cough - take Mucinex, I have also sent in a prescription for tessalon pearles (for cough)

## 2016-06-07 LAB — COMPLETE METABOLIC PANEL WITH GFR
ALBUMIN: 4.6 g/dL (ref 3.6–5.1)
ALK PHOS: 74 U/L (ref 33–130)
ALT: 18 U/L (ref 6–29)
AST: 21 U/L (ref 10–35)
BILIRUBIN TOTAL: 0.5 mg/dL (ref 0.2–1.2)
BUN: 10 mg/dL (ref 7–25)
CO2: 26 mmol/L (ref 20–31)
Calcium: 9.8 mg/dL (ref 8.6–10.4)
Chloride: 103 mmol/L (ref 98–110)
Creat: 0.74 mg/dL (ref 0.50–1.05)
GFR, Est African American: >89 mL/min (ref >=60)
GLUCOSE: 93 mg/dL (ref 65–99)
Potassium: 4 mmol/L (ref 3.5–5.3)
SODIUM: 141 mmol/L (ref 135–146)
TOTAL PROTEIN: 7.5 g/dL (ref 6.1–8.1)

## 2016-06-07 LAB — LIPASE: LIPASE: 24 U/L (ref 7–60)

## 2016-06-09 ENCOUNTER — Encounter: Payer: Self-pay | Admitting: Family Medicine

## 2016-06-11 ENCOUNTER — Other Ambulatory Visit: Payer: Self-pay | Admitting: Obstetrics and Gynecology

## 2016-06-11 DIAGNOSIS — Z1231 Encounter for screening mammogram for malignant neoplasm of breast: Secondary | ICD-10-CM

## 2016-06-16 ENCOUNTER — Encounter: Payer: Self-pay | Admitting: Family Medicine

## 2016-06-17 ENCOUNTER — Encounter: Payer: Self-pay | Admitting: Physician Assistant

## 2016-06-17 ENCOUNTER — Ambulatory Visit (INDEPENDENT_AMBULATORY_CARE_PROVIDER_SITE_OTHER): Payer: BLUE CROSS/BLUE SHIELD | Admitting: Physician Assistant

## 2016-06-17 VITALS — BP 100/60 | HR 80 | Ht <= 58 in | Wt 107.0 lb

## 2016-06-17 DIAGNOSIS — R1013 Epigastric pain: Secondary | ICD-10-CM | POA: Diagnosis not present

## 2016-06-17 DIAGNOSIS — K219 Gastro-esophageal reflux disease without esophagitis: Secondary | ICD-10-CM

## 2016-06-17 MED ORDER — ESOMEPRAZOLE MAGNESIUM 40 MG PO CPDR
40.0000 mg | DELAYED_RELEASE_CAPSULE | Freq: Every day | ORAL | 3 refills | Status: DC
Start: 2016-06-17 — End: 2017-07-13

## 2016-06-17 NOTE — Patient Instructions (Signed)
Please go to the basement level to have your lab stool test. Continue Nexium 40 md, take 1 tablet by mouth every morning.  You have been scheduled for an endoscopy. Please follow written instructions given to you at your visit today. If you use inhalers (even only as needed), please bring them with you on the day of your procedure. Your physician has requested that you go to www.startemmi.com and enter the access code given to you at your visit today. This web site gives a general overview about your procedure. However, you should still follow specific instructions given to you by our office regarding your preparation for the procedure.

## 2016-06-17 NOTE — Progress Notes (Signed)
Subjective:    Patient ID: [name], female    DOB: [birth year], 51 y.o.   MRN: [id number]  [name] is a 51 year old [place] female known to Dr. Ardis Hughs who is referred today by Dr. Ree Kida for evaluation of ongoing epigastric pain. Patient had colonoscopy done in March 2016 which was a normal exam. She had CT of the abdomen and pelvis in August 2017 which showed some bladder wall thickening consistent with cystitis was otherwise unremarkable. Most recent labs in January 2018 including chemistries unremarkable. Patient relates having had an endoscopy done in 2016 in [place]. She was told that she had inflammation in her stomach and to take a medicine similar to Nexium. She does not have a copy of this report. She has taken Nexium off and on in the past for epigastric discomfort and says that generally has helped. Over the past several months she has had more persistent problems with which she describes as burning in her chest and subxiphoid area and right upper quadrant. She has been on Nexium over the past 1 month and symptoms have improved she says every time she stops it seems to come back. She's not having any nausea or vomiting. She does state that intermittently she will have difficulty with her food "sticking" and have to regurgitate. No problems with changes in bowel habits melena or hematochezia. Weight has been stable appetite has been fine.  Review of Systems Pertinent positive and negative review of systems were noted in the above HPI section.  All other review of systems was otherwise negative.  Outpatient Encounter Prescriptions as of 06/17/2016  Medication Sig  . esomeprazole (NEXIUM) 40 MG capsule Take 1 capsule (40 mg total) by mouth daily.  Marland Kitchen LORATADINE PO Take by mouth as needed.  . polyethylene glycol powder (GLYCOLAX/MIRALAX) powder Take 17 g by mouth daily. Mix with 4-8 ounces of water. (Patient taking differently: Take 17 g by mouth daily as needed. Mix with 4-8 ounces of  water.)  . [DISCONTINUED] esomeprazole (NEXIUM) 40 MG capsule Take 1 capsule (40 mg total) by mouth daily.  . [DISCONTINUED] benzonatate (TESSALON) 100 MG capsule Take 1 capsule (100 mg total) by mouth 3 (three) times daily as needed for cough.  . [DISCONTINUED] OVER THE COUNTER MEDICATION Take 1 each by mouth daily. HAIR SKIN NAILS GUMMIES  . [DISCONTINUED] OVER THE COUNTER MEDICATION Take 1 tablet by mouth daily. CIRCULYN EXTRACT  . [DISCONTINUED] OVER THE COUNTER MEDICATION Take 1 capsule by mouth daily. SAFFLOWER EXTRACT   No facility-administered encounter medications on file as of 06/17/2016.    Allergies  Allergen Reactions  . Beef-Derived Products Hives   Patient Active Problem List   Diagnosis Date Noted  . Viral illness 06/06/2016  . Abdominal pain, chronic, epigastric 04/24/2016  . Right leg numbness 04/24/2016  . Chest pain 03/06/2016  . Bladder wall thickening 12/19/2015  . Hematuria 12/05/2015  . Left flank pain 11/29/2015  . Constipation 11/29/2015  . Blood in stool 11/29/2015  . Cervical polyp 10/22/2015  . Eustachian tube dysfunction 10/04/2015  . Menopausal vaginal dryness 10/04/2015  . Change in vision 10/04/2015  . GERD (gastroesophageal reflux disease) 03/08/2015  . Dizziness 03/08/2015  . Health maintenance examination 05/30/2014   Social History   Social History  . Marital status: Single    Spouse name: N/A  . Number of children: 1  . Years of education: N/A   Occupational History  . nail tech    Social History Main Topics  .  Smoking status: Never Smoker  . Smokeless tobacco: Never Used  . Alcohol use No  . Drug use: No  . Sexual activity: Not on file   Other Topics Concern  . Not on file   Social History Narrative  . No narrative on file    Ms. [name]'s family history is not on file.      Objective:    Vitals:   06/17/16 0832  BP: 100/60  Pulse: 80    Physical Exam  well-developed [place] female in no acute distress blood  pressure 100/60 pulse 80, height 4 foot 10, weight 107, BMI 22.3. HEENT ;nontraumatic, cephalic EOMI PERRLA sclera anicteric, Cardiovascular ;regular rate and rhythm with S1-S2 no murmur or gallop, Pulmonary clear bilaterally soft, basically nontender there is no palpable mass or hepatosplenomegaly bowel sounds are present, Rectal; exam not done, Extremities; no clubbing cyanosis or edema skin warm and dry, Neuropsych; mood and affect appropriate       Assessment & Plan:   #59 51 year old [place] female with persistent several month history of epigastric, subxiphoid and chest burning which is relieved with Nexium but recurs. Suspect chronic GERD, rule out peptic ulcer disease, chronic gastropathy or H. Pylori #2 intermittent solid food dysphagia, rule out esophageal stricture or ring #3  colon cancer screening-up-to-date with colonoscopy-negative colonoscopy March 2016  Plan; continue Nexium 40 mg by mouth every morning for now Anti-reflux regimen Check H. pylori stool antigen Schedule for EGD with Dr. Ardis Hughs with possible esophageal dilation. Procedure was discussed in detail with the patient including risks and benefits and she is agreeable to proceed.  Amanda Vidana S Timya Trimmer PA-C 06/17/2016   Cc: Amanda Dawn, MD

## 2016-06-18 ENCOUNTER — Ambulatory Visit (HOSPITAL_COMMUNITY): Payer: BLUE CROSS/BLUE SHIELD

## 2016-06-18 NOTE — Progress Notes (Signed)
I agree with the above note, plan 

## 2016-06-20 ENCOUNTER — Telehealth: Payer: Self-pay | Admitting: Physician Assistant

## 2016-06-20 NOTE — Telephone Encounter (Signed)
She was unsure how to collect the stool for the H Pylori test. I went to the lab and got written instructions which I read to the patient over the phone. Questions were invited and answered.

## 2016-06-23 ENCOUNTER — Other Ambulatory Visit: Payer: BLUE CROSS/BLUE SHIELD

## 2016-06-23 DIAGNOSIS — R1013 Epigastric pain: Secondary | ICD-10-CM

## 2016-06-25 LAB — H. PYLORI ANTIGEN, STOOL: H PYLORI AG STL: NEGATIVE

## 2016-07-10 ENCOUNTER — Ambulatory Visit: Payer: BLUE CROSS/BLUE SHIELD | Admitting: Family Medicine

## 2016-07-15 ENCOUNTER — Encounter: Payer: Self-pay | Admitting: Gastroenterology

## 2016-07-18 ENCOUNTER — Ambulatory Visit
Admission: RE | Admit: 2016-07-18 | Discharge: 2016-07-18 | Disposition: A | Discharge status: Home / Self Care | Payer: BLUE CROSS/BLUE SHIELD | Source: Ambulatory Visit | Attending: Obstetrics and Gynecology | Admitting: Obstetrics and Gynecology

## 2016-07-18 DIAGNOSIS — Z1231 Encounter for screening mammogram for malignant neoplasm of breast: Secondary | ICD-10-CM

## 2016-07-29 ENCOUNTER — Encounter: Payer: Self-pay | Admitting: Gastroenterology

## 2016-07-29 ENCOUNTER — Ambulatory Visit (AMBULATORY_SURGERY_CENTER): Payer: BLUE CROSS/BLUE SHIELD | Admitting: Gastroenterology

## 2016-07-29 VITALS — BP 101/75 | HR 61 | Temp 97.8°F | Resp 17 | Ht <= 58 in | Wt 107.0 lb

## 2016-07-29 DIAGNOSIS — R1310 Dysphagia, unspecified: Secondary | ICD-10-CM

## 2016-07-29 DIAGNOSIS — K219 Gastro-esophageal reflux disease without esophagitis: Secondary | ICD-10-CM | POA: Diagnosis not present

## 2016-07-29 MED ORDER — SODIUM CHLORIDE 0.9 % IV SOLN: 500.0000 mL | INTRAVENOUS | Status: DC

## 2016-07-29 NOTE — Progress Notes (Signed)
Report to PACU, RN, vss, BBS= Clear.  

## 2016-07-29 NOTE — Patient Instructions (Signed)
YOU HAD AN ENDOSCOPIC PROCEDURE TODAY AT THE [facility]:   Refer to the procedure report that was given to you for any specific questions about what was found during the examination.  If the procedure report does not answer your questions, please call your gastroenterologist to clarify.  If you requested that your care partner not be given the details of your procedure findings, then the procedure report has been included in a sealed envelope for you to review at your convenience later.  YOU SHOULD EXPECT: Some feelings of bloating in the abdomen. Passage of more gas than usual.  Walking can help get rid of the air that was put into your GI tract during the procedure and reduce the bloating. If you had a lower endoscopy (such as a colonoscopy or flexible sigmoidoscopy) you may notice spotting of blood in your stool or on the toilet paper. If you underwent a bowel prep for your procedure, you may not have a normal bowel movement for a few days.  Please Note:  You might notice some irritation and congestion in your nose or some drainage.  This is from the oxygen used during your procedure.  There is no need for concern and it should clear up in a day or so.  SYMPTOMS TO REPORT IMMEDIATELY:    Following upper endoscopy (EGD)  Vomiting of blood or coffee ground material  New chest pain or pain under the shoulder blades  Painful or persistently difficult swallowing  New shortness of breath  Fever of 100F or higher  Black, tarry-looking stools  For urgent or emergent issues, a gastroenterologist can be reached at any hour by calling [phone].    DIET:  We do recommend a small meal at first, but then you may proceed to your regular diet.  Drink plenty of fluids but you should avoid alcoholic beverages for 24 hours.  ACTIVITY:  You should plan to take it easy for the rest of today and you should NOT DRIVE or use heavy machinery until tomorrow (because of the sedation medicines  used during the test).    FOLLOW UP: Our staff will call the number listed on your records the next business day following your procedure to check on you and address any questions or concerns that you may have regarding the information given to you following your procedure. If we do not reach you, we will leave a message.  However, if you are feeling well and you are not experiencing any problems, there is no need to return our call.  We will assume that you have returned to your regular daily activities without incident.  If any biopsies were taken you will be contacted by phone or by letter within the next 1-3 weeks.  Please call us at [phone] if you have not heard about the biopsies in 3 weeks.    SIGNATURES/CONFIDENTIALITY: You and/or your care partner have signed paperwork which will be entered into your electronic medical record.  These signatures attest to the fact that that the information above on your After Visit Summary has been reviewed and is understood.  Full responsibility of the confidentiality of this discharge information lies with you and/or your care-partner.  Thank you for letting us take care of your healthcare needs today. 

## 2016-07-29 NOTE — Progress Notes (Signed)
Pt's states no medical or surgical changes since previsit or office visit. 

## 2016-07-29 NOTE — Progress Notes (Signed)
Upon waking up in recovery patient c/o right eye "burning." It was a little swollen as compared to the left eye. Josh Lakyn Alsteen CRNA notified. Sm

## 2016-07-29 NOTE — Op Note (Signed)
[facility] Patient Name: [name] Procedure Date: 07/29/2016 9:37 AM MRN: [id number] Endoscopist: Milus Banister , MD Age: 51 Referring MD:  Date of Birth: 05-17-1965 Gender: Female Account #: [id number] Procedure:                Upper GI endoscopy Indications:              Functional Dyspepsia, Dysphagia Medicines:                Monitored Anesthesia Care Procedure:                Pre-Anesthesia Assessment:                           - Prior to the procedure, a History and Physical                            was performed, and patient medications and                            allergies were reviewed. The patient's tolerance of                            previous anesthesia was also reviewed. The risks                            and benefits of the procedure and the sedation                            options and risks were discussed with the patient.                            All questions were answered, and informed consent                            was obtained. Prior Anticoagulants: The patient has                            taken no previous anticoagulant or antiplatelet                            agents. ASA Grade Assessment: II - A patient with                            mild systemic disease. After reviewing the risks                            and benefits, the patient was deemed in                            satisfactory condition to undergo the procedure.                           After obtaining informed consent, the endoscope was  passed under direct vision. Throughout the                            procedure, the patient's blood pressure, pulse, and                            oxygen saturations were monitored continuously. The                            Endoscope was introduced through the mouth, and                            advanced to the second part of duodenum. The upper                            GI endoscopy was  accomplished without difficulty.                            The patient tolerated the procedure well. Scope In: Scope Out: Findings:                 The esophagus was normal.                           The stomach was normal.                           The examined duodenum was normal. Complications:            No immediate complications. Estimated blood loss:                            None. Estimated Blood Loss:     Estimated blood loss: none. Impression:               - Normal esophagus.                           - Normal stomach.                           - Normal examined duodenum.                           - No specimens collected. Recommendation:           - Patient has a contact number available for                            emergencies. The signs and symptoms of potential                            delayed complications were discussed with the                            patient. Return to normal activities tomorrow.  Written discharge instructions were provided to the                            patient.                           - Resume previous diet. Chew your food well, eat                            slowly and take small bites.                           - Continue present medications.                           - No repeat upper endoscopy. Milus Banister, MD 07/29/2016 9:47:17 AM This report has been signed electronically.

## 2016-07-30 ENCOUNTER — Telehealth: Payer: Self-pay

## 2016-07-30 NOTE — Telephone Encounter (Signed)
  Follow up Call-  Call back number 07/29/2016 07/24/2014  Post procedure Call Back phone  # [phone]  Permission to leave phone message Yes Yes  Some recent data might be hidden     Patient questions:  Do you have a fever, pain , or abdominal swelling? No. Pain Score  0 *  Have you tolerated food without any problems? Yes.    Have you been able to return to your normal activities? Yes.    Do you have any questions about your discharge instructions: Diet   No. Medications  No. Follow up visit  No.  Do you have questions or concerns about your Care? No.  Actions: * If pain score is 4 or above: No action needed, pain <4.

## 2016-08-25 NOTE — Progress Notes (Signed)
51 y.o. year old female presents for well woman/preventative visit. Vietnamese video interpretor utilized to obtain history. See with Luretha Rued MS3.   Acute Concerns: Abdominal Pain - history of GERD treated with Nexium, recently referred to GI, scheduled for EGD (performed on 3/20 and normal), symptoms resolved with Nexium, no cough, no further abdominal pain Constipation - resolved, no longer taking Miralax Right Leg numbness - still occurs intermittently however less frequent than previously  Diet:1-2 fruits per day, very few vegetables (does eat in soup), no sweetened beverages/soda, eating less carbs, more fish in diet  Exercise: started walking/running in place 10 minutes per day  Sexual/Birth History: Not sexually active with boyfriend, in menopause (2015), still reports vaginal dryness and pain with intercourse, has not attempted an otc lubricants (as discussed at last visit), seen by OB/GYn (on [address]).   Birth Control: No birth control (menopause)  POA/Living Will: No living will or POA  Social:  Social History   Social History  . Marital status: Single    Spouse name: N/A  . Number of children: 1  . Years of education: N/A   Occupational History  . nail tech    Social History Main Topics  . Smoking status: Never Smoker  . Smokeless tobacco: Never Used  . Alcohol use No  . Drug use: No  . Sexual activity: Not Asked   Other Topics Concern  . None   Social History Narrative  . None    Immunization: Immunization History  Administered Date(s) Administered  . Influenza,inj,Quad PF,36+ Mos 03/14/2016  . Tdap 05/30/2014  Up to date on immunizations.   Cancer Screening:  Pap Smear: Performed by OB/GYN (Last 2015)  Mammogram: Completed 07/18/16 (BIRADS 1)  Colonoscopy: Completed 07/24/14 (no polyps, follow up in 10 years)  Dexa:Not a candidate    Physical Exam: VITALS: Reviewed GEN: Pleasant female, NAD HEENT: Normocephalic, PERRL, EOMI,  pterygium left medial eye, no scleral icterus, bilateral TM pearly grey, nasal septum midline, MMM, uvula midline, no anterior or posterior lymphadenopathy, no thyromegaly CARDIAC:RRR, S1 and S2 present, no murmur, no heaves/thrills RESP: CTAB, normal effort BREAST:Deferred to OB/GYN ABD: soft, RLQ tenderness, normal bowel sounds GU/GYN:Deferred to OB/GYN EXT: No edema, 2+ radial and DP pulses SKIN: no rash Neuro: CN 2-12 intact, strength 5/5 in all extremities, sensation to light touch intact in all extremities  ASSESSMENT & PLAN: 51 y.o. female presents for annual well woman/preventative exam and GYN exam. Please see problem specific assessment and plan.   Hematuria Seen by Urology in 02/2016. Less than 3 RBC/HPF. No further workup planned.   Health maintenance examination 51 y/o female presents for annual wellness exam. -up to date on immunizations -up to date on cancer screening -discussed importance of healthy diet and regular exercise -check lipid panel/cmp/cbc/tsh today  Right leg numbness Improved from last visit. MRI lumbar spine and laboratory workup negative.  -as not functionally limiting patient will continue to monitor, no further workup planned at this time  Pterygium eye, left Not affecting vision. Monitor  GERD (gastroesophageal reflux disease) Resolved with Nexium. Continue therapy.   Constipation Resolved. Continue prn Miralax  RLQ abdominal tenderness Chronic for patient. Present for over a year. CT in 2017 negative. Patient reports OB/GYN ordered US last year and results were negative (Per records from Dr. Ulanda Edison patient has [place] in 2015 which showed LSO, no masses).  -encouraged patient to follow up with GYN as suspect ovarian etiology.

## 2016-08-28 ENCOUNTER — Ambulatory Visit (INDEPENDENT_AMBULATORY_CARE_PROVIDER_SITE_OTHER): Payer: BLUE CROSS/BLUE SHIELD | Admitting: Family Medicine

## 2016-08-28 ENCOUNTER — Encounter: Payer: Self-pay | Admitting: Family Medicine

## 2016-08-28 DIAGNOSIS — Z0000 Encounter for general adult medical examination without abnormal findings: Secondary | ICD-10-CM

## 2016-08-28 DIAGNOSIS — R200 Anesthesia of skin: Secondary | ICD-10-CM

## 2016-08-28 DIAGNOSIS — H11002 Unspecified pterygium of left eye: Secondary | ICD-10-CM | POA: Diagnosis not present

## 2016-08-28 DIAGNOSIS — R10813 Right lower quadrant abdominal tenderness: Secondary | ICD-10-CM

## 2016-08-28 DIAGNOSIS — K5900 Constipation, unspecified: Secondary | ICD-10-CM

## 2016-08-28 DIAGNOSIS — K219 Gastro-esophageal reflux disease without esophagitis: Secondary | ICD-10-CM

## 2016-08-28 DIAGNOSIS — R319 Hematuria, unspecified: Secondary | ICD-10-CM | POA: Diagnosis not present

## 2016-08-28 NOTE — Assessment & Plan Note (Signed)
Resolved. Continue prn Miralax

## 2016-08-28 NOTE — Assessment & Plan Note (Signed)
Seen by Urology in 02/2016. Less than 3 RBC/HPF. No further workup planned.

## 2016-08-28 NOTE — Patient Instructions (Signed)
It was nice to see you today.  Continue taking the Nexium for relief of symptoms.  Endoscopy came back normal. If stomach pain returns let us know MRI was normal. If pain or numbness gets progressively worse then give [place] a call to make an appointment.  Labs done today will call to update you Great job on the excersise ? nng (Heartburn) ? nng l m?t lo?i ?au hay kh ch?u c th? x?y ra trong c? h?ng ho?c ng?c. N th??ng ???c m t? nh? m?t c?n ?au rt. N c?ng c th? gy ra m?t vi? ???ng trong mi?ng. ? nng c th? c?m th?y t?i t? h?n khi quy? vi? n?m xu?ng ho?c ci xu?ng, v n th??ng n?ng h?n vo ban ?m. ? nng c th? xa?y ra do ca?c th?? trong d? dy di chuy?n ng???c ln th?c qu?n (tra?o ng???c). H??NG D?N CH?M [place] T?I NH Th?c hi?n nh??ng hnh ??ng na?y ?? gi?m kh ch?u v gip trnh cc bi?n ch?ng. Ch? ?? ?n u?ng  Tun th? m?t ch? ?? ?n theo khuy?n ngh? c?a chuyn gia ch?m [place] s?c kh?e. Vi?c ny c th? l trnh cc th?c ?n v ?? u?ng nh?:  C ph v tr (c ho?c khng c caffeine).  ?? u?ng c ch?a r??u.  ?? u?ng t?ng l?c v ?? u?ng dng trong th? thao.  ?? u?ng c ga ho?c soda.  S c la v c ca.  B?c h v h??ng v? b?c h.  T?i v hnh.  C?i ng?a (Horseradish).  Cc th?c ?n nhi?u gia v? v a xt, bao g?m h?t tiu, b?t ?t, b?t ca ri, gi?m, n??c s?t cay, v n??c s?t barbecue.  N??c qu? ho?c qu? h? cam qut, ch?ng h?n nh? cam, chanh v chanh l cam.  Cc th?c ?n c c chua, nh? n??c x?t ??, ?t, n??c x?t salsa, v pizza km x?t ??Marland Kitchen  Th?c ?n chin v nhi?u ch?t bo, ch?ng h?n nh? bnh rn, khoai ty chin, khoai ty rn v n??c x?t nhi?u ch?t bo.  Th?t nhi?u ch?t bo, ch?ng h?n nh? hot dog (bnh m k?p xc xch) v cc lo?i th?t ?? v tr?ng nhi?u m?, ch?ng h?n nh? th?t n?c l?ng, xc xch, gi?m bng v th?t l?n xng khi.  Nh?ng s?n ph?m b? s?a giu ch?t bo, nh? s?a nguyn kem, b? v pho mt kem.  ?n cc b?a nh?, th??ng xuyn thay v cc b?a no.  Trnh u?ng nhi?u n??c  khi qu v? ?n.  Trnh ?n trong kho?ng 2-3 gi? tr??c khi ?i ng?.  Trnh n?m xu?ng ngay sau khi ?n.  Khng t?p th? d?c ngay sau khi ?n. H??ng d?n chung  Ch  ??n nh?ng thay ??i v? tri?u ch?ng c?a qu v?.  Ch? s? d?ng thu?c khng c?n k ??n v thu?c c?n k ??n theo ch? d?n c?a chuyn gia ch?m [place] s?c kh?e. Khng dng aspirin, ibuprofen, ho?c cc thu?c NSAID khc tr? khi chuyn gia ch?m [place] s?c kh?e c?a qu v? ba?o quy? vi? du?ng.  Khng s? d?ng b?t k? s?n ph?m thu?c l no, bao g?m thu?c l d?ng ht, thu?c l d?ng nhai v thu?c l ?i?n t?. N?u qu v? c?n gip ?? ?? cai thu?c, hy h?i chuyn gia ch?m  s?c kh?e.  M?c qu?n o r?ng. Khng m?c ci g ch?t quanh eo m c th? t?o p l?c ln b?ng quy? vi?.  Nng cao (nng) ??u gi??ng c?a quy? vi? kho?ng 6 inch (15  cm).  C? g?ng gi?m c?ng th?ng, ch?ng h?n nh? t?p yoga ho?c thi?n. N?u qu v? c?n gip ?? ?? gi?m c?ng th?ng, hy h?i chuyn gia ch?m [place] s?c kh?e.  N?u qu v? th?a cn, hy gi?m cn n?ng v? m?c c l?i cho s?c kh?e c?a qu v?. Hy h?i chuyn gia ch?m [place] s?c kh?e ?? ???c h??ng d?n v? m?c tiu gi?m cn an ton.  Tun th? t?t c? cc cu?c h?n khm l?i theo  ki?n c?a chuyn gia ch?m [place] s?c kh?e. ?i?u ny c vai tr quan tr?ng. ?I [facility] N?U:  Qu v? c cc tri?u ch?ng m?i.  Qu v? b? s?t cn khng r nguyn nhn.  Qu v? b? kh nu?t ho?c b? ?au khi nu?t.  Qu v? th? kh kh ho?c ho dai d?ng.  Cc tri?u ch?ng c?a qu v? khng c?i thi?n sau khi ???c ?i?u tr?Floyde Parkins? vi? bi? ? nng th??ng xuyn trong h?n hai tu?n. NGAY L?P T?C ?I [facility] N?U:  Qu v? b? ?au ? cnh tay, c?, hm, r?ng ho?c l?ng.  Qu v? th?y ?? m? hi, chng m?t ho?c chong vng.  Qu v? b? ?au ng?c ho?c th? d?c.  Qu v? nn v ch?t nn ra gi?ng nh? mu ho?c b c ph.  Phn c?a qu v? c mu ho?c mu ?en. Thng tin ny khng nh?m m?c ?ch thay th? cho l?i khuyn m chuyn gia ch?m [place] s?c kh?e ni v?i qu v?. Hy b?o ??m qu v? ph?i th?o lu?n b?t k? v?n ?? g m  qu v? c v?i chuyn gia ch?m [place] s?c kh?e c?a qu v?. Document Released: 08/20/2015 Document Revised: 08/20/2015 Document Reviewed: 08/23/2014 Elsevier Interactive Patient Education  2017 [employer].

## 2016-08-28 NOTE — Assessment & Plan Note (Signed)
Not affecting vision. Monitor

## 2016-08-28 NOTE — Assessment & Plan Note (Addendum)
Chronic for patient. Present for over a year. CT in 2017 negative other than small bladder thickening (follows with Urology). Patient reports OB/GYN ordered US last year and results were negative (Per records from Dr. Ulanda Edison patient has [place] in 2015 which showed LSO, no masses).  -encouraged patient to follow up with GYN as suspect ovarian etiology.

## 2016-08-28 NOTE — Assessment & Plan Note (Addendum)
51 y/o female presents for annual wellness exam. -up to date on immunizations -up to date on cancer screening -discussed importance of healthy diet and regular exercise -check lipid panel/cmp/cbc/tsh today

## 2016-08-28 NOTE — Assessment & Plan Note (Signed)
Improved from last visit. MRI lumbar spine and laboratory workup negative.  -as not functionally limiting patient will continue to monitor, no further workup planned at this time

## 2016-08-28 NOTE — Assessment & Plan Note (Signed)
Resolved with Nexium. Continue therapy.

## 2016-08-29 LAB — CMP14+EGFR
A/G RATIO: 1.7 (ref 1.2–2.2)
ALBUMIN: 4.7 g/dL (ref 3.5–5.5)
ALT: 18 IU/L (ref 0–32)
AST: 23 IU/L (ref 0–40)
Alkaline Phosphatase: 84 IU/L (ref 39–117)
BUN / CREAT RATIO: 12 (ref 9–23)
BUN: 9 mg/dL (ref 6–24)
Bilirubin Total: 0.3 mg/dL (ref 0.0–1.2)
CALCIUM: 10 mg/dL (ref 8.7–10.2)
CHLORIDE: 97 mmol/L (ref 96–106)
CO2: 28 mmol/L (ref 18–29)
Creatinine, Ser: 0.73 mg/dL (ref 0.57–1.00)
GFR, EST AFRICAN AMERICAN: 110 mL/min/{1.73_m2} (ref >59)
GFR, EST NON AFRICAN AMERICAN: 95 mL/min/{1.73_m2} (ref >59)
Globulin, Total: 2.7 g/dL (ref 1.5–4.5)
Glucose: 91 mg/dL (ref 65–99)
POTASSIUM: 4.5 mmol/L (ref 3.5–5.2)
Sodium: 139 mmol/L (ref 134–144)
TOTAL PROTEIN: 7.4 g/dL (ref 6.0–8.5)

## 2016-08-29 LAB — CBC
HEMATOCRIT: 40.5 % (ref 34.0–46.6)
HEMOGLOBIN: 13.6 g/dL (ref 11.1–15.9)
MCH: 31.1 pg (ref 26.6–33.0)
MCHC: 33.6 g/dL (ref 31.5–35.7)
MCV: 93 fL (ref 79–97)
Platelets: 256 10*3/uL (ref 150–379)
RBC: 4.38 x10E6/uL (ref 3.77–5.28)
RDW: 13 % (ref 12.3–15.4)
WBC: 4.7 10*3/uL (ref 3.4–10.8)

## 2016-08-29 LAB — LIPID PANEL
CHOLESTEROL TOTAL: 250 mg/dL — AB (ref 100–199)
Chol/HDL Ratio: 3.4 ratio (ref 0.0–4.4)
HDL: 73 mg/dL (ref >39)
LDL Calculated: 148 mg/dL — ABNORMAL HIGH (ref 0–99)
Triglycerides: 144 mg/dL (ref 0–149)
VLDL Cholesterol Cal: 29 mg/dL (ref 5–40)

## 2016-08-29 LAB — TSH: TSH: 3.87 u[IU]/mL (ref 0.450–4.500)

## 2016-09-01 ENCOUNTER — Encounter: Payer: Self-pay | Admitting: Family Medicine

## 2016-09-01 DIAGNOSIS — E785 Hyperlipidemia, unspecified: Secondary | ICD-10-CM | POA: Insufficient documentation

## 2017-06-12 ENCOUNTER — Other Ambulatory Visit: Payer: Self-pay | Admitting: Obstetrics and Gynecology

## 2017-06-12 DIAGNOSIS — Z139 Encounter for screening, unspecified: Secondary | ICD-10-CM

## 2017-07-13 ENCOUNTER — Other Ambulatory Visit: Payer: Self-pay | Admitting: Physician Assistant

## 2017-07-20 ENCOUNTER — Ambulatory Visit
Admission: RE | Admit: 2017-07-20 | Discharge: 2017-07-20 | Disposition: A | Discharge status: Home / Self Care | Payer: BLUE CROSS/BLUE SHIELD | Source: Ambulatory Visit | Attending: Obstetrics and Gynecology | Admitting: Obstetrics and Gynecology

## 2017-07-20 DIAGNOSIS — Z139 Encounter for screening, unspecified: Secondary | ICD-10-CM

## 2017-10-06 ENCOUNTER — Encounter: Payer: Self-pay | Admitting: Family Medicine

## 2017-10-06 ENCOUNTER — Ambulatory Visit (INDEPENDENT_AMBULATORY_CARE_PROVIDER_SITE_OTHER): Payer: BLUE CROSS/BLUE SHIELD | Admitting: Family Medicine

## 2017-10-06 VITALS — BP 122/80 | HR 59 | Temp 97.8°F | Wt 101.2 lb

## 2017-10-06 DIAGNOSIS — K219 Gastro-esophageal reflux disease without esophagitis: Secondary | ICD-10-CM | POA: Diagnosis not present

## 2017-10-06 DIAGNOSIS — R10813 Right lower quadrant abdominal tenderness: Secondary | ICD-10-CM

## 2017-10-06 DIAGNOSIS — K921 Melena: Secondary | ICD-10-CM | POA: Diagnosis not present

## 2017-10-06 DIAGNOSIS — Z0000 Encounter for general adult medical examination without abnormal findings: Secondary | ICD-10-CM | POA: Diagnosis not present

## 2017-10-06 DIAGNOSIS — E785 Hyperlipidemia, unspecified: Secondary | ICD-10-CM | POA: Diagnosis not present

## 2017-10-06 NOTE — Assessment & Plan Note (Addendum)
Long duration of RLQ discomfort is reassuring for benign etiology. Encouraged patient to consult OB/Gyn about pain at upcoming appointment in June and discuss with him whether ultrasound may be beneficial. One episode of blood in stool months ago after straining hard, which resolved and has not recurred. Check CBC with labs today. Given normal colonoscopy 3 years ago will not pursue further workup for rectal bleeding unless bleeding recurs or is anemic on CBC.

## 2017-10-06 NOTE — Patient Instructions (Addendum)
Good to see you today.  Ask Dr. Ulanda Edison if he wants to order another ultrasound of your pelvis  Ok to take nexium as needed, or we can switch to zantac. See handout below on reflux.  Monitor your stool, call if any further bleeding.  Checking labs today - cholesterol, kidneys, liver, blood counts.  Follow up in 1 year, sooner if needed.  Be well, Dr. Ardelia Mems       Health Maintenance, Female Adopting a healthy lifestyle and getting preventive care can go a long way to promote health and wellness. Talk with your health care provider about what schedule of regular examinations is right for you. This is a good chance for you to check in with your provider about disease prevention and staying healthy. In between checkups, there are plenty of things you can do on your own. Experts have done a lot of research about which lifestyle changes and preventive measures are most likely to keep you healthy. Ask your health care provider for more information. Weight and diet Eat a healthy diet  Be sure to include plenty of vegetables, fruits, low-fat dairy products, and lean protein.  Do not eat a lot of foods high in solid fats, added sugars, or salt.  Get regular exercise. This is one of the most important things you can do for your health. ? Most adults should exercise for at least 150 minutes each week. The exercise should increase your heart rate and make you sweat (moderate-intensity exercise). ? Most adults should also do strengthening exercises at least twice a week. This is in addition to the moderate-intensity exercise.  Maintain a healthy weight  Body mass index (BMI) is a measurement that can be used to identify possible weight problems. It estimates body fat based on height and weight. Your health care provider can help determine your BMI and help you achieve or maintain a healthy weight.  For females 21 years of age and older: ? A BMI below 18.5 is considered underweight. ? A BMI  of 18.5 to 24.9 is normal. ? A BMI of 25 to 29.9 is considered overweight. ? A BMI of 30 and above is considered obese.  Watch levels of cholesterol and blood lipids  You should start having your blood tested for lipids and cholesterol at 54 years of age, then have this test every 5 years.  You may need to have your cholesterol levels checked more often if: ? Your lipid or cholesterol levels are high. ? You are older than 54 years of age. ? You are at high risk for heart disease.  Cancer screening Lung Cancer  Lung cancer screening is recommended for adults 45-49 years old who are at high risk for lung cancer because of a history of smoking.  A yearly low-dose CT scan of the lungs is recommended for people who: ? Currently smoke. ? Have quit within the past 15 years. ? Have at least a 30-pack-year history of smoking. A pack year is smoking an average of one pack of cigarettes a day for 1 year.  Yearly screening should continue until it has been 15 years since you quit.  Yearly screening should stop if you develop a health problem that would prevent you from having lung cancer treatment.  Breast Cancer  Practice breast self-awareness. This means understanding how your breasts normally appear and feel.  It also means doing regular breast self-exams. Let your health care provider know about any changes, no matter how small.  If you  are in your 20s or 30s, you should have a clinical breast exam (CBE) by a health care provider every 1-3 years as part of a regular health exam.  If you are 76 or older, have a CBE every year. Also consider having a breast X-ray (mammogram) every year.  If you have a family history of breast cancer, talk to your health care provider about genetic screening.  If you are at high risk for breast cancer, talk to your health care provider about having an MRI and a mammogram every year.  Breast cancer gene (BRCA) assessment is recommended for women who have  family members with BRCA-related cancers. BRCA-related cancers include: ? Breast. ? Ovarian. ? Tubal. ? Peritoneal cancers.  Results of the assessment will determine the need for genetic counseling and BRCA1 and BRCA2 testing.  Cervical Cancer Your health care provider may recommend that you be screened regularly for cancer of the pelvic organs (ovaries, uterus, and vagina). This screening involves a pelvic examination, including checking for microscopic changes to the surface of your cervix (Pap test). You may be encouraged to have this screening done every 3 years, beginning at age 72.  For women ages 30-65, health care providers may recommend pelvic exams and Pap testing every 3 years, or they may recommend the Pap and pelvic exam, combined with testing for human papilloma virus (HPV), every 5 years. Some types of HPV increase your risk of cervical cancer. Testing for HPV may also be done on women of any age with unclear Pap test results.  Other health care providers may not recommend any screening for nonpregnant women who are considered low risk for pelvic cancer and who do not have symptoms. Ask your health care provider if a screening pelvic exam is right for you.  If you have had past treatment for cervical cancer or a condition that could lead to cancer, you need Pap tests and screening for cancer for at least 20 years after your treatment. If Pap tests have been discontinued, your risk factors (such as having a new sexual partner) need to be reassessed to determine if screening should resume. Some women have medical problems that increase the chance of getting cervical cancer. In these cases, your health care provider may recommend more frequent screening and Pap tests.  Colorectal Cancer  This type of cancer can be detected and often prevented.  Routine colorectal cancer screening usually begins at 54 years of age and continues through 54 years of age.  Your health care provider may  recommend screening at an earlier age if you have risk factors for colon cancer.  Your health care provider may also recommend using home test kits to check for hidden blood in the stool.  A small camera at the end of a tube can be used to examine your colon directly (sigmoidoscopy or colonoscopy). This is done to check for the earliest forms of colorectal cancer.  Routine screening usually begins at age 45.  Direct examination of the colon should be repeated every 5-10 years through 54 years of age. However, you may need to be screened more often if early forms of precancerous polyps or small growths are found.  Skin Cancer  Check your skin from head to toe regularly.  Tell your health care provider about any new moles or changes in moles, especially if there is a change in a mole's shape or color.  Also tell your health care provider if you have a mole that is larger than  the size of a pencil eraser.  Always use sunscreen. Apply sunscreen liberally and repeatedly throughout the day.  Protect yourself by wearing long sleeves, pants, a wide-brimmed hat, and sunglasses whenever you are outside.  Heart disease, diabetes, and high blood pressure  High blood pressure causes heart disease and increases the risk of stroke. High blood pressure is more likely to develop in: ? People who have blood pressure in the high end of the normal range (130-139/85-89 mm Hg). ? People who are overweight or obese. ? People who are African American.  If you are 81-75 years of age, have your blood pressure checked every 3-5 years. If you are 45 years of age or older, have your blood pressure checked every year. You should have your blood pressure measured twice-once when you are at a hospital or clinic, and once when you are not at a hospital or clinic. Record the average of the two measurements. To check your blood pressure when you are not at a hospital or clinic, you can use: ? An automated blood pressure  machine at a pharmacy. ? A home blood pressure monitor.  If you are between 21 years and 72 years old, ask your health care provider if you should take aspirin to prevent strokes.  Have regular diabetes screenings. This involves taking a blood sample to check your fasting blood sugar level. ? If you are at a normal weight and have a low risk for diabetes, have this test once every three years after 54 years of age. ? If you are overweight and have a high risk for diabetes, consider being tested at a younger age or more often. Preventing infection Hepatitis B  If you have a higher risk for hepatitis B, you should be screened for this virus. You are considered at high risk for hepatitis B if: ? You were born in a country where hepatitis B is common. Ask your health care provider which countries are considered high risk. ? Your parents were born in a high-risk country, and you have not been immunized against hepatitis B (hepatitis B vaccine). ? You have HIV or AIDS. ? You use needles to inject street drugs. ? You live with someone who has hepatitis B. ? You have had sex with someone who has hepatitis B. ? You get hemodialysis treatment. ? You take certain medicines for conditions, including cancer, organ transplantation, and autoimmune conditions.  Hepatitis C  Blood testing is recommended for: ? Everyone born from [birth year] through 1967. ? Anyone with known risk factors for hepatitis C.  Sexually transmitted infections (STIs)  You should be screened for sexually transmitted infections (STIs) including gonorrhea and chlamydia if: ? You are sexually active and are younger than 54 years of age. ? You are older than 54 years of age and your health care provider tells you that you are at risk for this type of infection. ? Your sexual activity has changed since you were last screened and you are at an increased risk for chlamydia or gonorrhea. Ask your health care provider if you are at  risk.  If you do not have HIV, but are at risk, it may be recommended that you take a prescription medicine daily to prevent HIV infection. This is called pre-exposure prophylaxis (PrEP). You are considered at risk if: ? You are sexually active and do not regularly use condoms or know the HIV status of your partner(s). ? You take drugs by injection. ? You are sexually active with a  partner who has HIV.  Talk with your health care provider about whether you are at high risk of being infected with HIV. If you choose to begin PrEP, you should first be tested for HIV. You should then be tested every 3 months for as long as you are taking PrEP. Pregnancy  If you are premenopausal and you may become pregnant, ask your health care provider about preconception counseling.  If you may become pregnant, take 400 to 800 micrograms (mcg) of folic acid every day.  If you want to prevent pregnancy, talk to your health care provider about birth control (contraception). Osteoporosis and menopause  Osteoporosis is a disease in which the bones lose minerals and strength with aging. This can result in serious bone fractures. Your risk for osteoporosis can be identified using a bone density scan.  If you are 18 years of age or older, or if you are at risk for osteoporosis and fractures, ask your health care provider if you should be screened.  Ask your health care provider whether you should take a calcium or vitamin D supplement to lower your risk for osteoporosis.  Menopause may have certain physical symptoms and risks.  Hormone replacement therapy may reduce some of these symptoms and risks. Talk to your health care provider about whether hormone replacement therapy is right for you. Follow these instructions at home:  Schedule regular health, dental, and eye exams.  Stay current with your immunizations.  Do not use any tobacco products including cigarettes, chewing tobacco, or electronic  cigarettes.  If you are pregnant, do not drink alcohol.  If you are breastfeeding, limit how much and how often you drink alcohol.  Limit alcohol intake to no more than 1 drink per day for nonpregnant women. One drink equals 12 ounces of beer, 5 ounces of wine, or 1 ounces of hard liquor.  Do not use street drugs.  Do not share needles.  Ask your health care provider for help if you need support or information about quitting drugs.  Tell your health care provider if you often feel depressed.  Tell your health care provider if you have ever been abused or do not feel safe at home. This information is not intended to replace advice given to you by your health care provider. Make sure you discuss any questions you have with your health care provider. Document Released: 11/11/2010 Document Revised: 10/04/2015 Document Reviewed: 01/30/2015 Elsevier Interactive Patient Education  2018 [place] Th?c ?n cho B?nh Tro Ng??c D? Dy Th?c Qu?n, Ng??i L?n Food Choices for Gastroesophageal Reflux Disease, Adult Khi qu v? b? b?nh tro ng??c d? dy th?c qu?n (GERD), th?c ph?m qu v? ?n v thi quen ?n u?ng c?a qu v? l r?t quan tr?ng. L?a ch?n th?c ph?m ?ng c th? gip lm gi?m s? kh ch?u c?a b?nh tro ng??c d? dy th?c qu?n (GERD). Cn nh?c lm vi?c v?i m?t chuyn gia v? ch? ?? ?n v dinh d??ng (chuyn gia dinh d??ng) nh?m gip qu v? l?a ch?n nh?ng th?c ph?m t?t cho s?c kh?e. Nh?ng h??ng d?n chung no ti ph?i tun theo? K? ho?ch ?n u?ng  Ch?n nh?ng th?c ph?m t?t cho s?c kh?e t ch?t bo, ch?ng h?n nh? tri cy, rau c?, ng? c?c nguyn h?t, cc s?n ph?m t? s?a t bo, v th?t n?c, c, v th?t gia c?m.  ?n cc b?a nh?, th??ng xuyn thay v ba b?a l?n m?i ngy. ?n ch?m ri, trong khng  gian th? gin. Trnh g?p ng??i ho?c n?m xu?ng cho ??n khi ?n xong ???c 2-3 gi?.  H?n ch? cc th?c ph?m giu ch?t bo ch?ng h?n nh? th?t nhi?u m? ho?c th?c ph?m chin.  Gi?i h?n l??ng tiu th?  d?u, b?, v ch?t bo t? d?u th?c v?t ? m?c d??i 8 mu?ng c ph m?i ngy.  Tra?nh nh??ng th?? sau: ? Cc th?c ph?m gy ra cc tri?u ch?ng. Nh?ng tri?u ch?ng ny c th? khc nhau ??i v?i nh?ng ng??i khc nhau. Hy ghi nh?t k th?c ph?m ?? theo di nh?ng th?c ph?m no gy ra cc tri?u ch?ng. ? R??u. ? U?ng nhi?u ch?t l?ng khi ?n. ? ?n trong kho?ng 2-3 gi? tr??c khi ?i ng?.  N?u ?n b?ng cc ph??ng php khc thay vi? chin. Ph??ng php ny c th? bao g?m b? l, n??ng, ho?c hun nng. L?i s?ng   Duy tr m??c cn n?ng c l?i cho s?c kh?e. H?i chuyn gia ch?m [place] s?c kh?e c?a quy? vi? xem m??c cn n??ng na?o la? t?t cho quy? vi?. N?u qu v? c?n gi?m cn, hy trao ??i v?i chuyn gia ch?m [place] s?c kh?e c?a qu v? ?? gi?m cn m?t cch an ton.  T?p th? d?c t?i thi?u 30 pht t? 5 ngy tr? ln m?i tu?n, ho?c theo ch? d?n c?a chuyn gia ch?m [place] s?c kh?e.  Trnh m?c qu?n o b ch?t quanh th?t l?ng v ng?c.  Khng s? d?ng b?t k? s?n ph?m no ch?a nicotine ho?c thu?c l, ch?ng ha?n nh? thu?c l d?ng ht v thu?c l ?i?n t?. N?u qu v? c?n gip ?? ?? cai thu?c, hy h?i chuyn gia ch?m [place] s?c kh?e.  Ng? v?i ??u gi??ng k cao. S? d?ng chm d??i n?m ho?c t?m k d??i khung gi??ng ?? nng ??u gi??ng ln. Nh?ng lo?i th?c ?n no khng ???c khuyn dng? Nh?ng m?c ???c li?t k c th? khng ph?i danh sch ??y ??. Hy trao ??i v?i bc s? chuyn khoa dinh d??ng xem cc l?a ch?n ch? ?? dinh d??ng no ph h?p nh?t v?i qu v?. Ng? c?c Bnh ng?t ho?c bnh m n??ng nhanh b? sung ch?t bo. Bnh m n??ng c?a Php. Rau c? Marlou Starks c? chin ng?p d?u. Khoai ty chin. M?i lo?i rau c? ???c ch? bi?n km thm ch?t bo. M?i lo?i rau c? gy ra cc tri?u ch?ng. ??i v?i m?t s? ng??i, nh?ng lo?i rau c? ny c th? bao g?m c chua v cc s?n ph?m t? c chua, ?t, hnh v t?i, v c?i ng?a. Tri cy M?i lo?i tri cy ???c ch? bi?n km thm ch?t bo. M?i lo?i tri cy c th? gy ra cc tri?u ch?ng. ??i v?i m?t s? ng??i, nh?ng lo?i tri cy ny  c th? bao g?m tri cy h? cam qut, ch?ng h?n nh? cam, b??i, qu? d?a, v chanh. Th?t v cc th?c ph?m ch?a protein khc Th?t giu ch?t bo, ch?ng h?n nh? th?t l?n ho?c th?t b nhi?u m?, xc xch, s??n, gi?m bng, l?p x??ng, [place] v th?t l?n mu?i xng khi. Th?t chin ho?c protein, bao g?m c chin v th?t g chin. Qu? h?ch v b? h?t. S?a. S?a nguyn kem v s?a s-c-la. Kem chuaSalome Spotted. Kem. Pho mt kem. S?a l?c. ?? u?ng C ph v tr, c ho?c khng c caffeine. ?? u?ng c ga. Soda. ?? u?ng t?ng l?c. N??c p tri cy lm t? tri cy chua (ch?ng h?n nh? cam ho?c b??i). N??c p c chua. ??  u?ng ch?a c?n. M? v d?u B?. B? th?c v?t. Ch?t bo t? d?u th?c v?t. B? s??a tru. K?o v ?? trng mi?ng S-c-la v c ca. Bnh rnCassell Smiles v? v cc th?c ph?m khc. H?t tiu. B?c h v b?c h l?c. M?i lo?i gia v?, th?o d??c, ho?c gia v? gy ra cc tri?u ch?ng. ??i v?i m?t s? ng??i, gia v? v cc th?c ph?m khc c th? bao g?m c ri, n??c s?t nng, ho?c d?u d?m ?? tr?n salad. Tm t?t  Khi qu v? b? b?nh tro ng??c d? dy th?c qu?n (GERD), cc l?a ch?n th?c ph?m v l?i s?ng c vai tr r?t quan tr?ng ?? gip lm gi?m s? kh ch?u c?a GERD.  ?n cc b?a nh?, th??ng xuyn thay v ba b?a l?n m?i ngy. ?n ch?m ri, trong khng gian th? gin. Trnh g?p ng??i ho?c n?m xu?ng cho ??n khi ?n xong ???c 2-3 gi?.  H?n ch? cc th?c ph?m giu ch?t bo, ch?ng h?n nh? th?t m? ho?c th?c ph?m chin. Thng tin ny khng nh?m m?c ?ch thay th? cho l?i khuyn m chuyn gia ch?m [place] s?c kh?e ni v?i qu v?. Hy b?o ??m qu v? ph?i th?o lu?n b?t k? v?n ?? g m qu v? c v?i chuyn gia ch?m [place] s?c kh?e c?a qu v?. Document Released: 08/15/2016 Document Revised: 08/15/2016 Elsevier Interactive Patient Education  [employer].

## 2017-10-06 NOTE — Assessment & Plan Note (Signed)
Will switch medication from Nexium to Zantac given her concerns about bone health with PPI once her supply of Nexium runs out. - Encouraged her to continue to use medication on an as needed basis

## 2017-10-06 NOTE — Progress Notes (Signed)
Date of Visit: 10/06/2017   HPI:  Patient presents today for a well woman exam.   Concerns today: Need for blood work, episodic RLQ pain, desire to stop PPI  RLQ pain - Pain is episodic in nature and has been ongoing for past year - Occurs only at night; patient notices it when patient she wakes up to use restroom in middle of night - Pain does not wake her up and does not prevent her from going back to sleep; it is gone in morning when she wakes up - Pain is localized to right lower quadrant, just superior to groin - Has previously had similar pain; pelvic ultrasound (completed ~2 years ago) showed no ovarian cysts, abdominal ultrasound showed thickening of gallbladder but no kidney findings - Denies hematuria; 1 episode of blood in stool a few weeks ago after episode of constipation and heavy straining, none since then. Had normal colonoscopy in 2016. - No fever, chills, fatigue  Desire to discontinue PPI - Has concerns about impact that long-term PPI use can have on bone health - Stopped taking Nexium daily about 2 months ago; has only taken it 2-3 times since for severe GERD symptoms   Periods: post-menopausal Contraception: N/A Pelvic symptoms: episodic pain in right lower quadrant at night for past year Sexual activity: Not sexually active STD Screening: Declined this today Pap smear status: Scheduled to have Pap smear in June with gynecologist Dr. Ulanda Edison Exercise: Walks 10-15 min 3-4x per week during breaks at work; also does core exercises about 1x per week Diet: Well-balanced with vegetables and fish, chicken pork; no red meat; rice with most meals (small portion) Smoking: none Alcohol: none Mood: no concerns Dentist: Sees dentist regularly  ROS: See HPI  [place]:  Cancers in family: No known  PHYSICAL EXAM: BP 122/80 (BP Location: Right Arm, Patient Position: Sitting, Cuff Size: Normal)   Pulse (!) 59   Temp 97.8 F (36.6 C) (Oral)   Wt 101 lb 3.2 oz (45.9 kg)    LMP 06/28/2012   SpO2 99%   BMI 21.15 kg/m  Gen: NAD, pleasant, cooperative HEENT: NCAT, PERRL, pterygium of medial aspect left eye;  no palpable thyromegaly or anterior cervical lymphadenopathy Heart: RRR, no murmurs Lungs: CTAB, NWOB Abdomen: soft, nontender to palpation Neuro: grossly nonfocal, speech normal Extremities: No appreciable lower extremity edema bilaterally   ASSESSMENT/PLAN:  RLQ abdominal tenderness Long duration of RLQ discomfort is reassuring for benign etiology. Encouraged patient to consult OB/Gyn about pain at upcoming appointment in June and discuss with him whether ultrasound may be beneficial. One episode of blood in stool months ago after straining hard, which resolved and has not recurred. Check CBC with labs today. Given normal colonoscopy 3 years ago will not pursue further workup for rectal bleeding unless bleeding recurs or is anemic on CBC.  GERD (gastroesophageal reflux disease) Will switch medication from Nexium to Zantac given her concerns about bone health with PPI once her supply of Nexium runs out. - Encouraged her to continue to use medication on an as needed basis  Health maintenance:  -STD screening: declined -pap smear: scheduled for 10/26/17 with gynecologist -mammogram: UTD (completed on 07/20/17) -lipid screening: ordered today along with CMET -DEXA: N/A -immunizations: N/A  Pterygium - not obstructing vision. Advised follow up with eye doctor if patient feels it is worsening.  FOLLOW UP: Follow up in 1 year for annual physical, sooner if needed.  Michael Litter, [facility]  Patient seen along with  MS3 student Michael Litter. I personally evaluated this patient along with the student, and verified all aspects of the history, physical exam, and medical decision making as documented by the student. I agree with the student's documentation and have made all necessary edits.  Chrisandra Netters, MD [place]

## 2017-10-07 LAB — CMP14+EGFR
A/G RATIO: 1.7 (ref 1.2–2.2)
ALBUMIN: 4.5 g/dL (ref 3.5–5.5)
ALT: 24 IU/L (ref 0–32)
AST: 23 IU/L (ref 0–40)
Alkaline Phosphatase: 71 IU/L (ref 39–117)
BUN / CREAT RATIO: 10 (ref 9–23)
BUN: 7 mg/dL (ref 6–24)
Bilirubin Total: 0.4 mg/dL (ref 0.0–1.2)
CO2: 25 mmol/L (ref 20–29)
Calcium: 9.6 mg/dL (ref 8.7–10.2)
Chloride: 100 mmol/L (ref 96–106)
Creatinine, Ser: 0.7 mg/dL (ref 0.57–1.00)
GFR calc non Af Amer: 99 mL/min/{1.73_m2} (ref >59)
GFR, EST AFRICAN AMERICAN: 114 mL/min/{1.73_m2} (ref >59)
Globulin, Total: 2.7 g/dL (ref 1.5–4.5)
Glucose: 90 mg/dL (ref 65–99)
POTASSIUM: 4.4 mmol/L (ref 3.5–5.2)
Sodium: 140 mmol/L (ref 134–144)
TOTAL PROTEIN: 7.2 g/dL (ref 6.0–8.5)

## 2017-10-07 LAB — LIPID PANEL
Chol/HDL Ratio: 2.9 ratio (ref 0.0–4.4)
Cholesterol, Total: 217 mg/dL — ABNORMAL HIGH (ref 100–199)
HDL: 75 mg/dL (ref >39)
LDL Calculated: 123 mg/dL — ABNORMAL HIGH (ref 0–99)
Triglycerides: 97 mg/dL (ref 0–149)
VLDL Cholesterol Cal: 19 mg/dL (ref 5–40)

## 2017-10-07 LAB — CBC
HEMATOCRIT: 37 % (ref 34.0–46.6)
HEMOGLOBIN: 12.4 g/dL (ref 11.1–15.9)
MCH: 30.8 pg (ref 26.6–33.0)
MCHC: 33.5 g/dL (ref 31.5–35.7)
MCV: 92 fL (ref 79–97)
Platelets: 230 10*3/uL (ref 150–450)
RBC: 4.02 x10E6/uL (ref 3.77–5.28)
RDW: 12.8 % (ref 12.3–15.4)
WBC: 4.1 10*3/uL (ref 3.4–10.8)

## 2017-10-09 ENCOUNTER — Encounter: Payer: Self-pay | Admitting: Family Medicine

## 2017-10-28 LAB — HM PAP SMEAR

## 2017-12-07 IMAGING — CT CT ABD-PELV W/O CM
2 of 4 series · 16 of 46 positions shown, 18 images · non-contrast
Comparison: None.

CLINICAL DATA: Left flank pain and gross hematuria for
approximately 1 month

EXAM:
CT ABDOMEN AND PELVIS WITHOUT CONTRAST
TECHNIQUE: Multidetector CT imaging of the abdomen and pelvis was performed
following the standard protocol without oral or intravenous contrast
material administration.

[Series 2: rtn a/p w/o · axial · non-contrast · 0.74mm/px · z∈[-434,-49]mm · 13 of 87 slices shown, 15 images]
[im 5/87  soft-tissue]
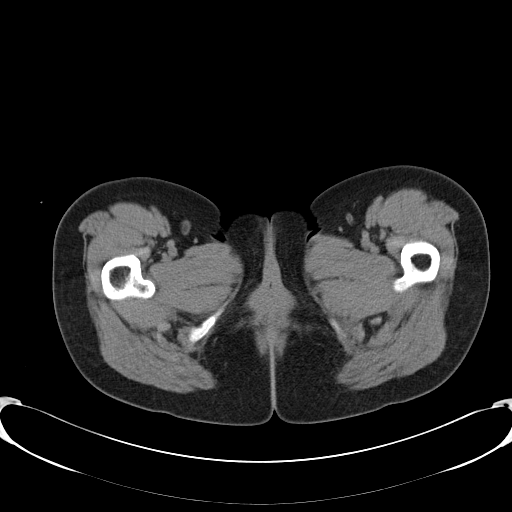
[im 5/87  bone]
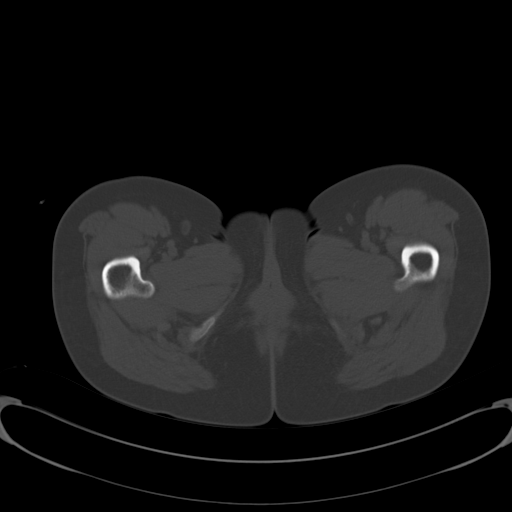
[im 13/87  soft-tissue]
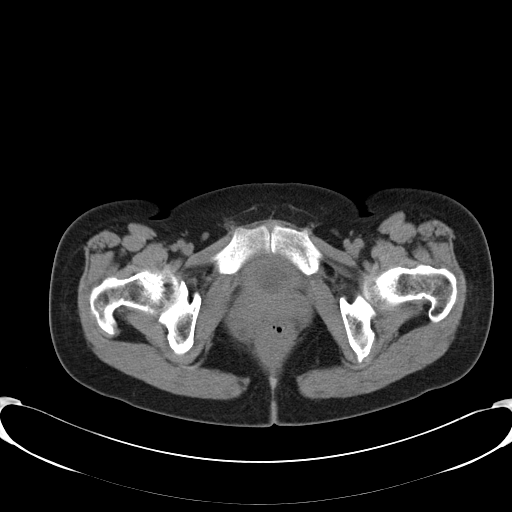
[im 18/87  soft-tissue]
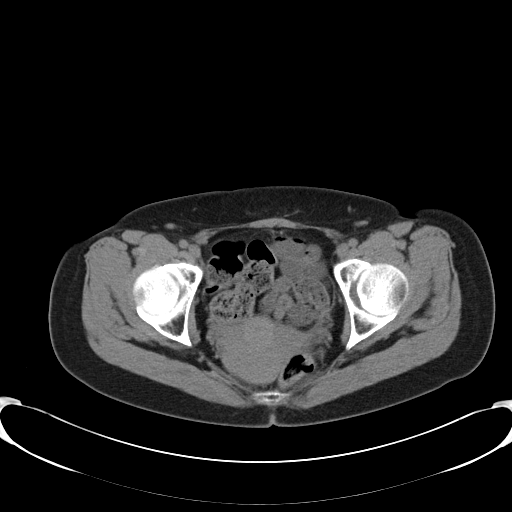
[im 26/87  soft-tissue]
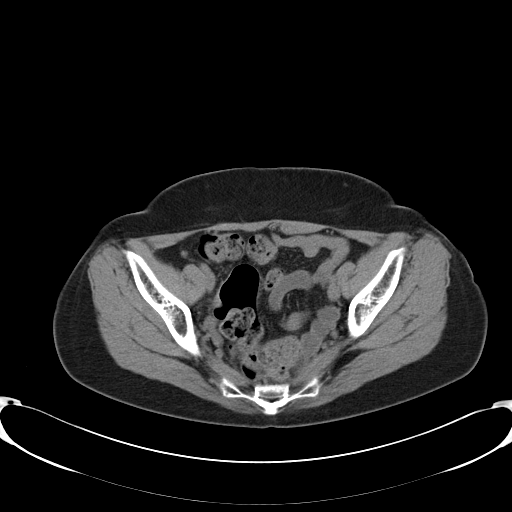
[im 31/87  soft-tissue]
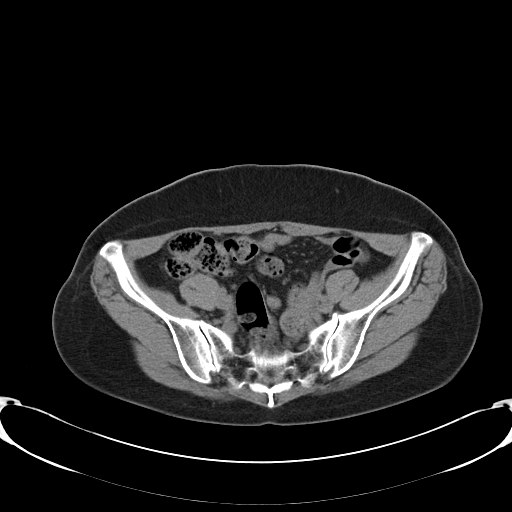
[im 39/87  soft-tissue]
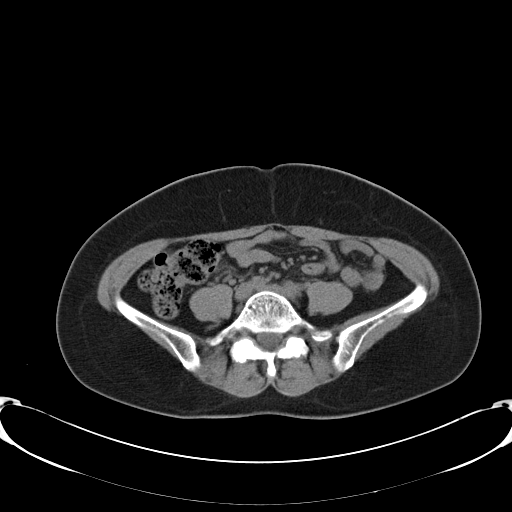
[im 44/87  soft-tissue]
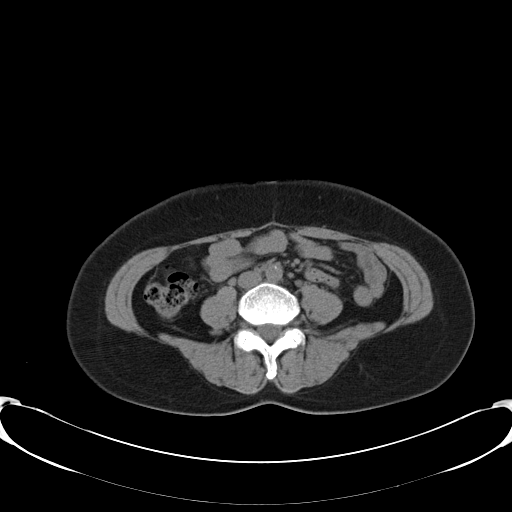
[im 48/87  soft-tissue]
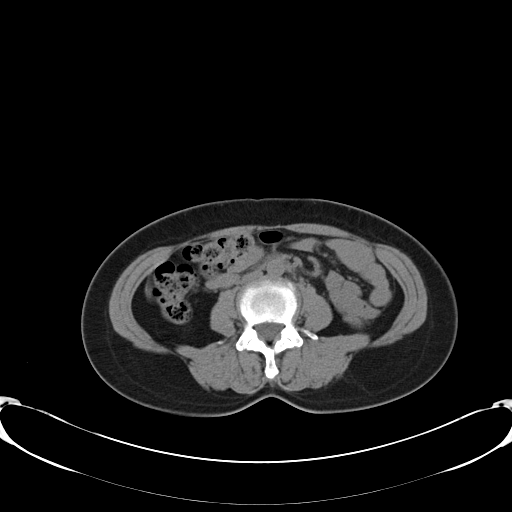
[im 56/87  soft-tissue]
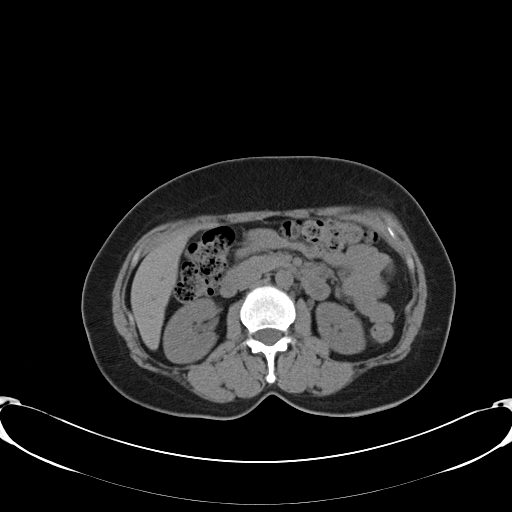
[im 56/87  bone]
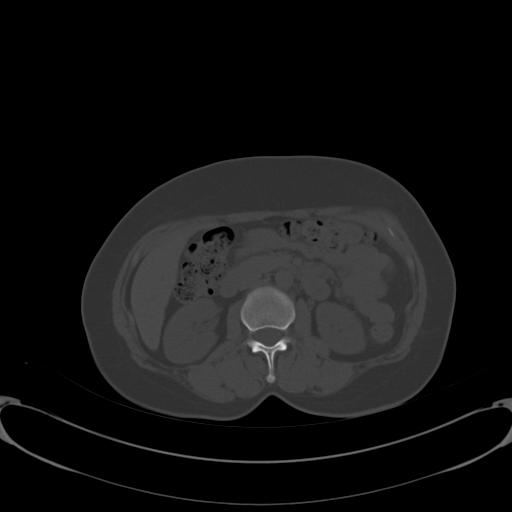
[im 61/87  soft-tissue]
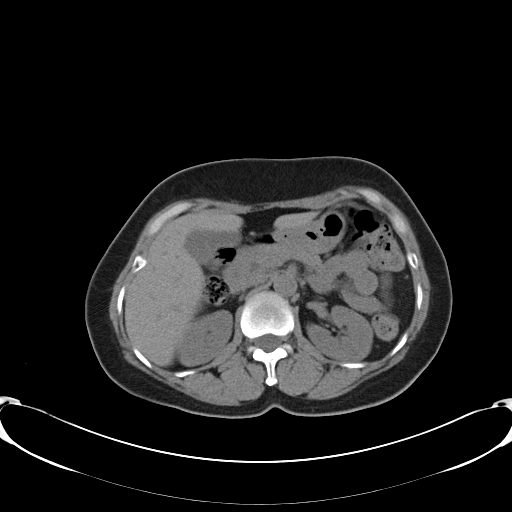
[im 69/87  soft-tissue]
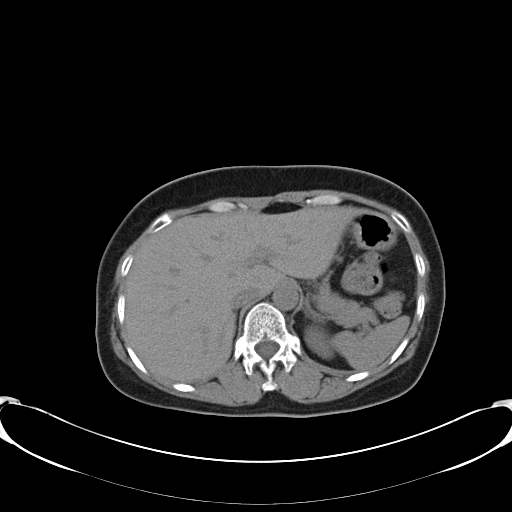
[im 74/87  soft-tissue]
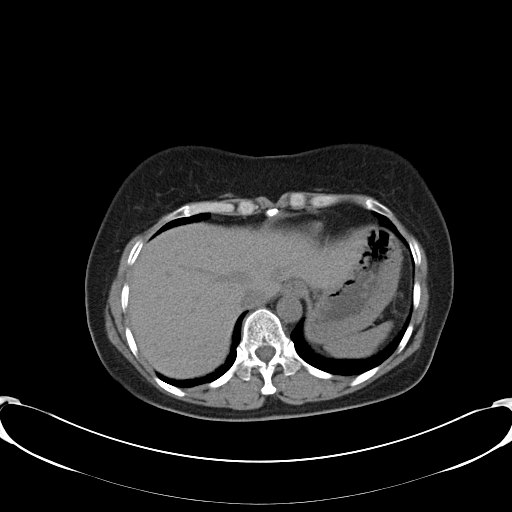
[im 82/87  soft-tissue]
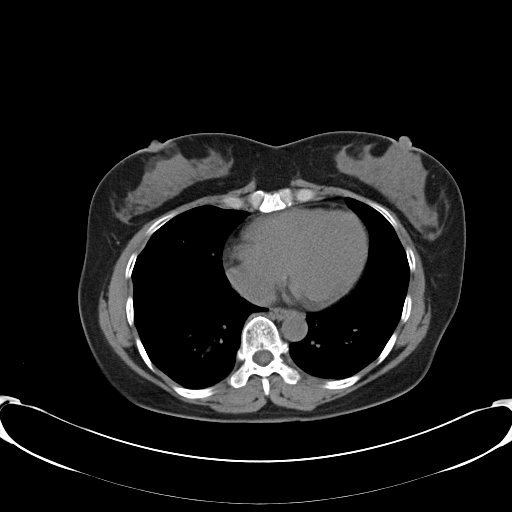

[Series 603: <mpr thick range> · coronal · 0.85mm/px · 3 of 91 slices shown]
[im 31/91  soft-tissue]
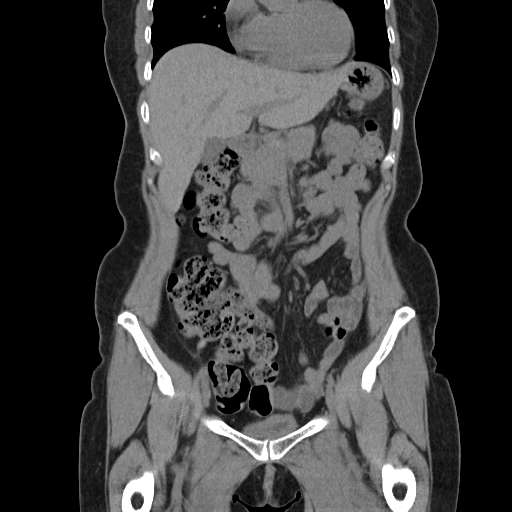
[im 41/91  soft-tissue]
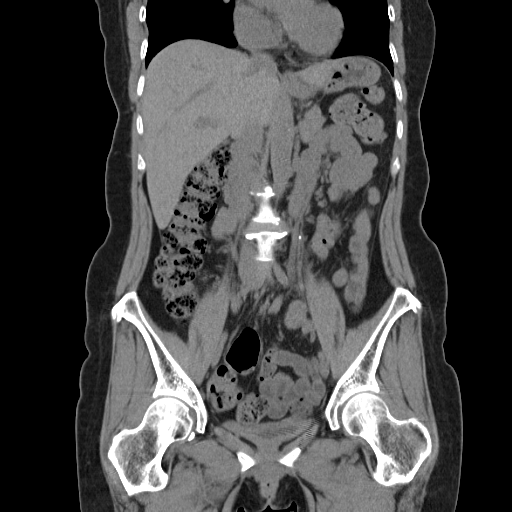
[im 51/91  soft-tissue]
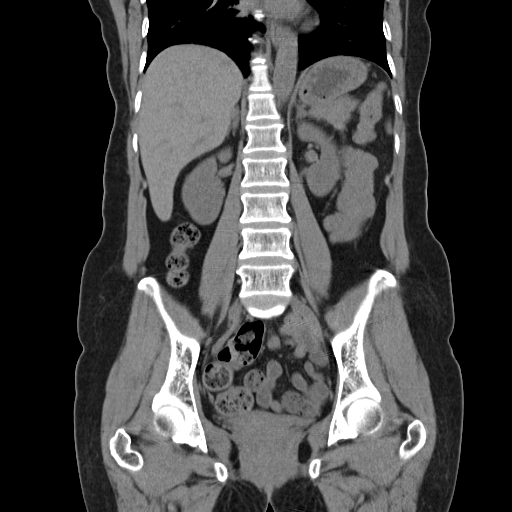

[16 of 46 positions shown; findings below may reference images not displayed]

FINDINGS: Lower chest: There are occasional small bullae in the lung bases.
There is no lung base edema or consolidation.

Hepatobiliary: No focal liver lesions are identified on this
noncontrast enhanced study. The gallbladder wall is not appreciably
thickened. There is no biliary duct dilatation.

Pancreas: There is no pancreatic mass or inflammatory focus.

Spleen: No splenic lesions are evident.

Adrenals/Urinary Tract: Adrenals appear normal bilaterally. There is
no renal mass or hydronephrosis on either side. There is no renal or
ureteral calculus on either side. There is a small left ovarian vein
phlebolith immediate adjacent to but separate from the proximal left
ureter. The urinary bladder is midline. The urinary bladder wall
appears thickened given the degree of urinary bladder distention.

Stomach/Bowel: There is no appreciable bowel wall or mesenteric
thickening. No bowel obstruction. No free air or portal venous air.

Vascular/Lymphatic: There is a small amount of calcification in the
abdominal aorta. No abdominal aortic aneurysm. Major mesenteric
vessels appear patent on this noncontrast enhanced study. There is
no demonstrable adenopathy in the abdomen or pelvis.

Reproductive: Uterus is retroverted. There is no pelvic mass or
pelvic fluid collection.

Other: The appendix appears normal. There is no ascites or abscess
in the abdomen or pelvis.

Musculoskeletal: There are no blastic or lytic bone lesions. There
is no intramuscular or abdominal wall lesion.
IMPRESSION: Question a degree of cystitis given apparent urinary bladder wall
thickening. This finding may warrant correlation with urinalysis.

No renal or ureteral calculus.  No hydronephrosis.

Small amount of aortic atherosclerosis.

No bowel obstruction. No abscess. Appendix appears normal. Uterus
retroverted but otherwise normal in size and contour.

## 2018-03-08 ENCOUNTER — Ambulatory Visit: Payer: BLUE CROSS/BLUE SHIELD

## 2018-04-05 ENCOUNTER — Other Ambulatory Visit: Payer: Self-pay | Admitting: Family Medicine

## 2018-04-05 MED ORDER — ZOSTER VAC RECOMB ADJUVANTED 50 MCG/0.5ML IM SUSR: 0.5000 mL | Freq: Once | INTRAMUSCULAR | 1 refills | Status: AC

## 2018-04-19 ENCOUNTER — Other Ambulatory Visit: Payer: Self-pay

## 2018-04-19 DIAGNOSIS — K5900 Constipation, unspecified: Secondary | ICD-10-CM

## 2018-04-22 MED ORDER — POLYETHYLENE GLYCOL 3350 17 GM/SCOOP PO POWD: 17.0000 g | Freq: Every day | ORAL | 1 refills | Status: DC | PRN

## 2018-04-22 NOTE — Telephone Encounter (Signed)
2nd request received. Alisa Brake, RN ([facility] RN)  

## 2018-06-04 ENCOUNTER — Encounter: Payer: Self-pay | Admitting: Family Medicine

## 2019-02-27 ENCOUNTER — Other Ambulatory Visit: Payer: Self-pay

## 2019-02-27 ENCOUNTER — Emergency Department (HOSPITAL_COMMUNITY)
Admission: EM | Admit: 2019-02-27 | Discharge: 2019-02-28 | Disposition: A | Discharge status: Home / Self Care | Payer: BLUE CROSS/BLUE SHIELD | Attending: Emergency Medicine | Admitting: Emergency Medicine

## 2019-02-27 ENCOUNTER — Emergency Department (HOSPITAL_COMMUNITY): Payer: BLUE CROSS/BLUE SHIELD

## 2019-02-27 ENCOUNTER — Encounter (HOSPITAL_COMMUNITY): Payer: Self-pay | Admitting: Emergency Medicine

## 2019-02-27 DIAGNOSIS — R079 Chest pain, unspecified: Secondary | ICD-10-CM | POA: Diagnosis present

## 2019-02-27 DIAGNOSIS — U071 COVID-19: Secondary | ICD-10-CM | POA: Diagnosis not present

## 2019-02-27 DIAGNOSIS — R1013 Epigastric pain: Secondary | ICD-10-CM | POA: Diagnosis not present

## 2019-02-27 LAB — CBC WITH DIFFERENTIAL/PLATELET
Abs Immature Granulocytes: 0.01 10*3/uL (ref 0.00–0.07)
Basophils Absolute: 0 10*3/uL (ref 0.0–0.1)
Basophils Relative: 0 %
Eosinophils Absolute: 0 10*3/uL (ref 0.0–0.5)
Eosinophils Relative: 0 %
HCT: 38.7 % (ref 36.0–46.0)
Hemoglobin: 13.2 g/dL (ref 12.0–15.0)
Immature Granulocytes: 0 %
Lymphocytes Relative: 34 %
Lymphs Abs: 1.2 10*3/uL (ref 0.7–4.0)
MCH: 31.7 pg (ref 26.0–34.0)
MCHC: 34.1 g/dL (ref 30.0–36.0)
MCV: 93 fL (ref 80.0–100.0)
Monocytes Absolute: 0.3 10*3/uL (ref 0.1–1.0)
Monocytes Relative: 10 %
Neutro Abs: 1.9 10*3/uL (ref 1.7–7.7)
Neutrophils Relative %: 56 %
Platelets: 143 10*3/uL — ABNORMAL LOW (ref 150–400)
RBC: 4.16 MIL/uL (ref 3.87–5.11)
RDW: 10.9 % — ABNORMAL LOW (ref 11.5–15.5)
WBC: 3.5 10*3/uL — ABNORMAL LOW (ref 4.0–10.5)
nRBC: 0 % (ref 0.0–0.2)

## 2019-02-27 LAB — COMPREHENSIVE METABOLIC PANEL
ALT: 32 U/L (ref 0–44)
AST: 37 U/L (ref 15–41)
Albumin: 4.6 g/dL (ref 3.5–5.0)
Alkaline Phosphatase: 69 U/L (ref 38–126)
Anion gap: 9 (ref 5–15)
BUN: 9 mg/dL (ref 6–20)
CO2: 26 mmol/L (ref 22–32)
Calcium: 9.3 mg/dL (ref 8.9–10.3)
Chloride: 96 mmol/L — ABNORMAL LOW (ref 98–111)
Creatinine, Ser: 0.58 mg/dL (ref 0.44–1.00)
GFR calc Af Amer: >60 mL/min (ref >60)
GFR calc non Af Amer: >60 mL/min (ref >60)
Glucose, Bld: 116 mg/dL — ABNORMAL HIGH (ref 70–99)
Potassium: 4.3 mmol/L (ref 3.5–5.1)
Sodium: 131 mmol/L — ABNORMAL LOW (ref 135–145)
Total Bilirubin: 0.6 mg/dL (ref 0.3–1.2)
Total Protein: 7.5 g/dL (ref 6.5–8.1)

## 2019-02-27 LAB — TROPONIN I (HIGH SENSITIVITY): Troponin I (High Sensitivity): 7 ng/L (ref <18)

## 2019-02-27 LAB — LIPASE, BLOOD: Lipase: 26 U/L (ref 11–51)

## 2019-02-27 MED ORDER — ESOMEPRAZOLE MAGNESIUM 40 MG PO CPDR: 40.0000 mg | DELAYED_RELEASE_CAPSULE | Freq: Every morning | ORAL | 0 refills | Status: DC

## 2019-02-27 MED ORDER — ALBUTEROL SULFATE HFA 108 (90 BASE) MCG/ACT IN AERS: 2.0000 | INHALATION_SPRAY | RESPIRATORY_TRACT | Status: DC | PRN

## 2019-02-27 MED ORDER — LIDOCAINE VISCOUS HCL 2 % MT SOLN
15.0000 mL | Freq: Once | OROMUCOSAL | Status: AC
  Administered 2019-02-27: 15 mL via ORAL
  Filled 2019-02-27: qty 15

## 2019-02-27 MED ORDER — ONDANSETRON HCL 4 MG PO TABS: 4.0000 mg | ORAL_TABLET | Freq: Three times a day (TID) | ORAL | 0 refills | Status: DC | PRN

## 2019-02-27 MED ORDER — BENZONATATE 100 MG PO CAPS: 100.0000 mg | ORAL_CAPSULE | Freq: Three times a day (TID) | ORAL | 0 refills | Status: DC

## 2019-02-27 MED ORDER — ALUM & MAG HYDROXIDE-SIMETH 200-200-20 MG/5ML PO SUSP
30.0000 mL | Freq: Once | ORAL | Status: AC
  Administered 2019-02-27: 30 mL via ORAL
  Filled 2019-02-27: qty 30

## 2019-02-27 NOTE — Discharge Instructions (Addendum)
Use albuterol inhaler 2 puffs every 4 hours as needed for shortness of breath.  Take tessalon perles for cough.  Take zofran for nausea, and continue taking your nexium or prilosec but do not take both. Return if your symptoms worsen or if you have other concerns.

## 2019-02-27 NOTE — ED Triage Notes (Signed)
Pt c/o epigastric pain. Pt Covid + since being tested 3 days ago at an urgent care after [place] outside of [place]. Pt states she has been inconsistent with nexium and she recently ate pineapples and peppers and now having epigastric burning, no vomiting. Pt c/o nausea. Pt states pain improves with belching and the use of honey and water.

## 2019-02-27 NOTE — ED Provider Notes (Signed)
[facility] DEPT Provider Note   CSN: [id number] Arrival date & time: 02/27/19  [id number]     History   Chief Complaint Chief Complaint  Patient presents with  . Chest Pain  . Abdominal Pain    HPI [facility] [name] is a 53 y.o. female.     The history is provided by the patient and medical records. No language interpreter was used.  Chest Pain Associated symptoms: abdominal pain   Abdominal Pain Associated symptoms: chest pain      53 year old obese female with history of GERD, hyperlipidemia, chronic abdominal pain, recently test positive for COVID-19 3 days ago presenting for epigastric pain.  Patient reports she was driving a car with a family recently test positive for COVID-19.  Incident happened approximately 3 days ago.  Since been around sick contact she did develop cough, decrease in appetite, body aches, and not feeling well.  She was also tested for COVID-19 and was notified yesterday that she tested positive.  Last night she developed pain to her epigastric region.  She described as a burning type sensation has been persistent and keeping her up at night.  Rates pain at this time is 5 out of 10 and nonradiating.  Pain feels somewhat similar to prior GERD flare.  She did admits to eating some pineapple with squid last night and felt that it may have aggravated her symptoms. sts she haven't been compliant with her nexium for her GERD.  She denies any specific treatment tried.  She denies any significant shortness of breath loss of taste or smell but mentioned that she has decreased appetite.  She denies alcohol or tobacco use.  She denies having fever.  No diarrhea or constipation.  No nausea or vomiting.  Past Medical History:  Diagnosis Date  . Allergy   . Chest pain 03/06/2016      . Dizziness 03/08/2015  . GERD (gastroesophageal reflux disease)   . Hyperlipidemia    BORDERLINE  . Left flank pain 11/29/2015    Patient Active Problem List    Diagnosis Date Noted  . Hyperlipidemia 09/01/2016  . Pterygium eye, left 08/28/2016  . RLQ abdominal tenderness 08/28/2016  . Abdominal pain, chronic, epigastric 04/24/2016  . Right leg numbness 04/24/2016  . Bladder wall thickening 12/19/2015  . Hematuria 12/05/2015  . Constipation 11/29/2015  . Blood in stool 11/29/2015  . Cervical polyp 10/22/2015  . Eustachian tube dysfunction 10/04/2015  . Menopausal vaginal dryness 10/04/2015  . Change in vision 10/04/2015  . GERD (gastroesophageal reflux disease) 03/08/2015  . Health maintenance examination 05/30/2014    Past Surgical History:  Procedure Laterality Date  . CESAREAN SECTION  07/1999   x1  . OOPHORECTOMY Left 2009   Left Ovarian Mass     OB History   No obstetric history on file.      Home Medications    Prior to Admission medications   Medication Sig Start Date End Date Taking? Authorizing Provider  esomeprazole (NEXIUM) 40 MG capsule Take 1 capsule (40 mg total) by mouth every morning. 07/13/17   Esterwood, Amy S, PA-C  LORATADINE PO Take by mouth as needed.    [provider]  polyethylene glycol powder (GLYCOLAX/MIRALAX) powder Take 17 g by mouth daily as needed. Mix with 4-8 ounces of water. 04/22/18   Leeanne Rio, MD    Family History Family History  Problem Relation Age of Onset  . Diabetes Father   . Colon cancer Neg Hx   .  Rectal cancer Neg Hx   . Stomach cancer Neg Hx     Social History Social History   Tobacco Use  . Smoking status: Never Smoker  . Smokeless tobacco: Never Used  Substance Use Topics  . Alcohol use: No    Alcohol/week: 0.0 standard drinks  . Drug use: No     Allergies   Beef-derived products   Review of Systems Review of Systems  Cardiovascular: Positive for chest pain.  Gastrointestinal: Positive for abdominal pain.  All other systems reviewed and are negative.    Physical Exam Updated Vital Signs BP 116/87 (BP Location: Left Arm)   Pulse  62   Temp 98.4 F (36.9 C) (Oral)   Resp 18   LMP 06/28/2012   SpO2 98%   Physical Exam Vitals signs and nursing note reviewed.  Constitutional:      General: She is not in acute distress.    Appearance: She is well-developed.  HENT:     Head: Atraumatic.  Eyes:     Conjunctiva/sclera: Conjunctivae normal.  Neck:     Musculoskeletal: Neck supple.  Cardiovascular:     Rate and Rhythm: Normal rate and regular rhythm.     Heart sounds: Normal heart sounds.  Pulmonary:     Effort: Pulmonary effort is normal.     Breath sounds: Normal breath sounds.  Chest:     Chest wall: No tenderness.  Abdominal:     Palpations: Abdomen is soft.     Tenderness: There is abdominal tenderness (Mild epigastric tenderness without guarding or rebound tenderness). There is no guarding or rebound.  Skin:    Findings: No rash.  Neurological:     Mental Status: She is alert and oriented to person, place, and time.  Psychiatric:        Mood and Affect: Mood normal.      ED Treatments / Results  Labs (all labs ordered are listed, but only abnormal results are displayed) Labs Reviewed  CBC WITH DIFFERENTIAL/PLATELET - Abnormal; Notable for the following components:      Result Value   WBC 3.5 (*)    RDW 10.9 (*)    Platelets 143 (*)    All other components within normal limits  COMPREHENSIVE METABOLIC PANEL - Abnormal; Notable for the following components:   Sodium 131 (*)    Chloride 96 (*)    Glucose, Bld 116 (*)    All other components within normal limits  LIPASE, BLOOD  TROPONIN I (HIGH SENSITIVITY)  TROPONIN I (HIGH SENSITIVITY)    EKG EKG Interpretation  Date/Time:  Sunday February 27 2019 09:01:06 EDT Ventricular Rate:  59 PR Interval:    QRS Duration: 74 QT Interval:  401 QTC Calculation: 398 R Axis:   67 Text Interpretation:  Sinus rhythm Abnormal R-wave progression, early transition Baseline wander in lead(s) I III aVL Since last tracing rate faster Confirmed by Dorie Rank [phone]) on 02/27/2019 10:15:27 AM   Radiology Dg Chest Portable 1 View  Result Date: 02/27/2019 CLINICAL DATA:  Epigastric pain, COVID positive EXAM: PORTABLE CHEST 1 VIEW COMPARISON:  None. FINDINGS: Lungs are clear.  No pleural effusion or pneumothorax. The heart is normal in size. IMPRESSION: No evidence of acute cardiopulmonary disease in this patient with known COVID. Electronically Signed   By: Julian Hy M.D.   On: 02/27/2019 10:37    Procedures Procedures (including critical care time)  Medications Ordered in ED Medications  albuterol (VENTOLIN HFA) 108 (90 Base) MCG/ACT inhaler 2  puff (has no administration in time range)  alum & mag hydroxide-simeth (MAALOX/MYLANTA) 200-200-20 MG/5ML suspension 30 mL (30 mLs Oral Given 02/27/19 1012)    And  lidocaine (XYLOCAINE) 2 % viscous mouth solution 15 mL (15 mLs Oral Given 02/27/19 1012)     Initial Impression / Assessment and Plan / ED Course  I have reviewed the triage vital signs and the nursing notes.  Pertinent labs & imaging results that were available during my care of the patient were reviewed by me and considered in my medical decision making (see chart for details).        BP 116/87 (BP Location: Left Arm)   Pulse 62   Temp 98.4 F (36.9 C) (Oral)   Resp 18   LMP 06/28/2012   SpO2 98%    Final Clinical Impressions(s) / ED Diagnoses   Final diagnoses:  COVID-19 virus detected  Epigastric pain    ED Discharge Orders         Ordered    esomeprazole (NEXIUM) 40 MG capsule   Every morning - 10a    Note to Pharmacy: Any further refill requests will require an office visit.   02/27/19 1205    benzonatate (TESSALON) 100 MG capsule  Every 8 hours     02/27/19 1205    ondansetron (ZOFRAN) 4 MG tablet  Every 8 hours PRN     02/27/19 1205         9:45 AM Patient was recently tested positive for COVID-19 a few days ago.  She does have some COVID symptoms.  Her primary complaint today is epigastric  pain described as a burning sensation suggestive of GERD.  She does not have any significant cardiac risk factors.  No shortness of breath to suggest PE.  She is otherwise well-appearing.  Will provide symptomatic treatment, will obtain screening labs including EKG and troponin.  Chest x-ray ordered.  12:02 PM Work-up fairly unremarkable.  Normal chest x-ray, electrolyte panels are reassuring, EKG unremarkable, normal lipase, normal troponin.  Patient did report mild improvement of her symptoms with GI cocktail.  She disposed concerns for shortness of breath and not feeling well likely secondary to current COVID symptoms.  Discharged home with albuterol inhaler, cough medication, and PPI.  Return precaution discussed.  Patient is not hypoxic and is afebrile.  Jordanne Carney [place] T Washer was evaluated in Emergency Department on 02/27/2019 for the symptoms described in the history of present illness. She was evaluated in the context of the global COVID-19 pandemic, which necessitated consideration that the patient might be at risk for infection with the SARS-CoV-2 virus that causes COVID-19. Institutional protocols and algorithms that pertain to the evaluation of patients at risk for COVID-19 are in a state of rapid change based on information released by regulatory bodies including the CDC and federal and state organizations. These policies and algorithms were followed during the patient's care in the ED.    Domenic Moras, PA-C 02/27/19 1208    Dorie Rank, MD 02/28/19 [phone]

## 2019-05-16 ENCOUNTER — Telehealth: Payer: Self-pay

## 2019-05-16 LAB — HM PAP SMEAR

## 2019-05-16 NOTE — Telephone Encounter (Signed)
Spoke with daughter with confirming pts appt for 05/17/19. pts daughter stated that her mother will be able to make the appt tomorrow. pts daughter also went over the COVID screening question with me for her mother. Salvatore Marvel, CMA

## 2019-05-17 ENCOUNTER — Ambulatory Visit (INDEPENDENT_AMBULATORY_CARE_PROVIDER_SITE_OTHER): Payer: 59 | Admitting: Family Medicine

## 2019-05-17 ENCOUNTER — Other Ambulatory Visit: Payer: Self-pay

## 2019-05-17 VITALS — BP 100/60 | HR 57 | Wt 97.6 lb

## 2019-05-17 DIAGNOSIS — K219 Gastro-esophageal reflux disease without esophagitis: Secondary | ICD-10-CM | POA: Diagnosis not present

## 2019-05-17 DIAGNOSIS — E785 Hyperlipidemia, unspecified: Secondary | ICD-10-CM | POA: Diagnosis not present

## 2019-05-17 MED ORDER — OMEPRAZOLE 20 MG PO CPDR: 20.0000 mg | DELAYED_RELEASE_CAPSULE | Freq: Every day | ORAL | 3 refills | Status: DC

## 2019-05-17 NOTE — Patient Instructions (Addendum)
It was great to see you again today!  Refilled your acid reflux medication. Ok to take if needed. Can take 2 at a time if one pill doesn't do the trick.  Checking labs today - kidneys, liver, cholesterol  Be well, Dr. Ardelia Mems     L?a Ch?n Th?c ?n cho B?nh Tro Ng??c D? Dy Th?c Qu?n, Ng??i L?n Food Choices for Gastroesophageal Reflux Disease, Adult Khi qu v? b? b?nh tro ng??c d? dy th?c qu?n (GERD), th?c ph?m qu v? ?n v thi quen ?n u?ng c?a qu v? l r?t quan tr?ng. L?a ch?n th?c ph?m ?ng c th? gip lm gi?m s? kh ch?u c?a b?nh tro ng??c d? dy th?c qu?n (GERD). Cn nh?c lm vi?c v?i m?t chuyn gia v? ch? ?? ?n v dinh d??ng (chuyn gia dinh d??ng) nh?m gip qu v? l?a ch?n nh?ng th?c ph?m t?t cho s?c kh?e. Nh?ng h??ng d?n chung no ti ph?i tun theo?  K? ho?ch ?n u?ng  Ch?n nh?ng th?c ph?m t?t cho s?c kh?e t ch?t bo, ch?ng h?n nh? tri cy, rau c?, ng? c?c nguyn h?t, cc s?n ph?m t? s?a t bo, v th?t n?c, c, v th?t gia c?m.  ?n cc b?a nh?, th??ng xuyn thay v ba b?a l?n m?i ngy. ?n ch?m ri, trong khng gian th? gin. Trnh g?p ng??i ho?c n?m xu?ng cho ??n khi ?n xong ???c 2-3 gi?.  H?n ch? cc th?c ph?m giu ch?t bo ch?ng h?n nh? th?t nhi?u m? ho?c th?c ph?m chin.  Gi?i h?n l??ng tiu th? d?u, b?, v ch?t bo t? d?u th?c v?t ? m?c d??i 8 mu?ng c ph m?i ngy.  Tra?nh nh??ng th?? sau: ? Cc th?c ph?m gy ra cc tri?u ch?ng. Nh?ng tri?u ch?ng ny c th? khc nhau ??i v?i nh?ng ng??i khc nhau. Hy ghi nh?t k th?c ph?m ?? theo di nh?ng th?c ph?m no gy ra cc tri?u ch?ng. ? R??u. ? U?ng nhi?u ch?t l?ng khi ?n. ? ?n trong kho?ng 2-3 gi? tr??c khi ?i ng?.  N?u ?n b?ng cc ph??ng php khc thay vi? chin. Ph??ng php ny c th? bao g?m b? l, n??ng, ho?c hun nng. L?i s?ng  Duy tr m??c cn n?ng c l?i cho s?c kh?e. H?i chuyn gia ch?m [place] s?c kh?e c?a quy? vi? xem m??c cn n??ng na?o la? t?t cho quy? vi?. N?u qu v? c?n gi?m cn, hy trao ??i v?i chuyn  gia ch?m [place] s?c kh?e c?a qu v? ?? gi?m cn m?t cch an ton.  T?p th? d?c t?i thi?u 30 pht t? 5 ngy tr? ln m?i tu?n, ho?c theo ch? d?n c?a chuyn gia ch?m [place] s?c kh?e.  Trnh m?c qu?n o b ch?t quanh th?t l?ng v ng?c.  Khng s? d?ng b?t k? s?n ph?m no ch?a nicotine ho?c thu?c l, ch?ng ha?n nh? thu?c l d?ng ht v thu?c l ?i?n t?. N?u qu v? c?n gip ?? ?? cai thu?c, hy h?i chuyn gia ch?m [place] s?c kh?e.  Ng? v?i ??u gi??ng k cao. S? d?ng chm d??i n?m ho?c t?m k d??i khung gi??ng ?? nng ??u gi??ng ln. Nh?ng lo?i th?c ?n no khng ???c khuyn dng? Nh?ng m?c ???c li?t k c th? khng ph?i danh sch ??y ??. Hy trao ??i v?i bc s? chuyn khoa dinh d??ng xem cc l?a ch?n ch? ?? dinh d??ng no ph h?p nh?t v?i qu v?. Ng? c?c Bnh ng?t ho?c bnh m n??ng nhanh b? sung ch?t bo. Bnh m n??ng  c?a PhpMarlou Starks c? Marlou Starks c? chin ng?p d?u. Khoai ty chin. M?i lo?i rau c? ???c ch? bi?n km thm ch?t bo. M?i lo?i rau c? gy ra cc tri?u ch?ng. ??i v?i m?t s? ng??i, nh?ng lo?i rau c? ny c th? bao g?m c chua v cc s?n ph?m t? c chua, ?t, hnh v t?i, v c?i ng?a. Tri cy M?i lo?i tri cy ???c ch? bi?n km thm ch?t bo. M?i lo?i tri cy c th? gy ra cc tri?u ch?ng. ??i v?i m?t s? ng??i, nh?ng lo?i tri cy ny c th? bao g?m tri cy h? cam qut, ch?ng h?n nh? cam, b??i, qu? d?a, v chanh. Th?t v cc th?c ph?m ch?a protein khc Th?t giu ch?t bo, ch?ng h?n nh? th?t l?n ho?c th?t b nhi?u m?, xc xch, s??n, gi?m bng, l?p x??ng, [place] v th?t l?n mu?i xng khi. Th?t chin ho?c protein, bao g?m c chin v th?t g chin. Qu? h?ch v b? h?t. S?a. S?a nguyn kem v s?a s-c-la. Kem chuaSalome Spotted. Kem. Pho mt kem. S?a l?c. ?? u?ng C ph v tr, c ho?c khng c caffeine. ?? u?ng c ga. Soda. ?? u?ng t?ng l?c. N??c p tri cy lm t? tri cy chua (ch?ng h?n nh? cam ho?c b??i). N??c p c chua. ?? u?ng ch?a c?n. M? v d?u B?. B? th?c v?t. Ch?t bo t? d?u th?c v?t. B? s??a tru. K?o  v ?? trng mi?ng S-c-la v c ca. Bnh rnCassell Smiles v? v cc th?c ph?m khc. H?t tiu. B?c h v b?c h l?c. M?i lo?i gia v?, th?o d??c, ho?c gia v? gy ra cc tri?u ch?ng. ??i v?i m?t s? ng??i, gia v? v cc th?c ph?m khc c th? bao g?m c ri, n??c s?t nng, ho?c d?u d?m ?? tr?n salad. Tm t?t  Khi qu v? b? b?nh tro ng??c d? dy th?c qu?n (GERD), cc l?a ch?n th?c ph?m v l?i s?ng c vai tr r?t quan tr?ng ?? gip lm gi?m s? kh ch?u c?a GERD.  ?n cc b?a nh?, th??ng xuyn thay v ba b?a l?n m?i ngy. ?n ch?m ri, trong khng gian th? gin. Trnh g?p ng??i ho?c n?m xu?ng cho ??n khi ?n xong ???c 2-3 gi?.  H?n ch? cc th?c ph?m giu ch?t bo, ch?ng h?n nh? th?t m? ho?c th?c ph?m chin. Thng tin ny khng nh?m m?c ?ch thay th? cho l?i khuyn m chuyn gia ch?m [place] s?c kh?e ni v?i qu v?. Hy b?o ??m qu v? ph?i th?o lu?n b?t k? v?n ?? g m qu v? c v?i chuyn gia ch?m [place] s?c kh?e c?a qu v?. Document Revised: 08/15/2016 Elsevier Patient Education  [employer].

## 2019-05-17 NOTE — Progress Notes (Signed)
Date of Visit: 05/17/2019   HPI:  Patient presents today for a well woman exam.   Concerns today: needs omeprazole refilled. Wants to avoid taking every day. Asks if she can take as needed. Notices worse with spicy foods.  Periods: no Contraception: postmenopausal Pap smear status: sees GYN regularly, just saw Diet: eats healthy Smoking: no Alcohol: no Drugs: no  ROS: See HPI  PHYSICAL EXAM: BP 100/60   Pulse (!) 57   Wt 97 lb 9.6 oz (44.3 kg)   LMP 06/28/2012   SpO2 100%   BMI 20.40 kg/m  Gen: NAD, pleasant, cooperative HEENT: NCAT,  no palpable thyromegaly or anterior cervical lymphadenopathy Heart: RRR, no murmurs Lungs: CTAB, NWOB Abdomen: soft, nontender to palpation Neuro: grossly nonfocal, speech normal Extremities: No appreciable lower extremity edema bilaterally   ASSESSMENT/PLAN:  Health maintenance:  - current on pap and mammogram through ob/gyn office -immunizations: UTD -colonoscopy - current  Hyperlipidemia Update lipids & CMET today  GERD (gastroesophageal reflux disease) Stable. Refill omeprazole. Advised ok to take daily as needed     FOLLOW UP: Follow up in 1 year for next physical  [place] J. Ardelia Mems, [place]

## 2019-05-18 LAB — CMP14+EGFR
ALT: 26 IU/L (ref 0–32)
AST: 28 IU/L (ref 0–40)
Albumin/Globulin Ratio: 1.9 (ref 1.2–2.2)
Albumin: 4.9 g/dL (ref 3.8–4.9)
Alkaline Phosphatase: 90 IU/L (ref 39–117)
BUN/Creatinine Ratio: 18 (ref 9–23)
BUN: 12 mg/dL (ref 6–24)
Bilirubin Total: 0.4 mg/dL (ref 0.0–1.2)
CO2: 26 mmol/L (ref 20–29)
Calcium: 9.7 mg/dL (ref 8.7–10.2)
Chloride: 99 mmol/L (ref 96–106)
Creatinine, Ser: 0.65 mg/dL (ref 0.57–1.00)
GFR calc Af Amer: 116 mL/min/{1.73_m2} (ref >59)
GFR calc non Af Amer: 100 mL/min/{1.73_m2} (ref >59)
Globulin, Total: 2.6 g/dL (ref 1.5–4.5)
Glucose: 87 mg/dL (ref 65–99)
Potassium: 4.2 mmol/L (ref 3.5–5.2)
Sodium: 138 mmol/L (ref 134–144)
Total Protein: 7.5 g/dL (ref 6.0–8.5)

## 2019-05-18 LAB — LIPID PANEL
Chol/HDL Ratio: 3.2 ratio (ref 0.0–4.4)
Cholesterol, Total: 262 mg/dL — ABNORMAL HIGH (ref 100–199)
HDL: 82 mg/dL (ref >39)
LDL Chol Calc (NIH): 166 mg/dL — ABNORMAL HIGH (ref 0–99)
Triglycerides: 86 mg/dL (ref 0–149)
VLDL Cholesterol Cal: 14 mg/dL (ref 5–40)

## 2019-05-19 ENCOUNTER — Encounter: Payer: Self-pay | Admitting: Family Medicine

## 2019-05-20 NOTE — Assessment & Plan Note (Signed)
Update lipids & CMET today

## 2019-05-20 NOTE — Assessment & Plan Note (Signed)
Stable. Refill omeprazole. Advised ok to take daily as needed

## 2019-06-26 ENCOUNTER — Encounter (HOSPITAL_COMMUNITY): Payer: Self-pay

## 2019-06-26 ENCOUNTER — Ambulatory Visit (HOSPITAL_COMMUNITY)
Admission: EM | Admit: 2019-06-26 | Discharge: 2019-06-26 | Disposition: A | Discharge status: Home / Self Care | Payer: 59 | Attending: Emergency Medicine | Admitting: Emergency Medicine

## 2019-06-26 ENCOUNTER — Other Ambulatory Visit: Payer: Self-pay

## 2019-06-26 ENCOUNTER — Ambulatory Visit (INDEPENDENT_AMBULATORY_CARE_PROVIDER_SITE_OTHER): Payer: 59

## 2019-06-26 DIAGNOSIS — R319 Hematuria, unspecified: Secondary | ICD-10-CM | POA: Diagnosis not present

## 2019-06-26 DIAGNOSIS — R2231 Localized swelling, mass and lump, right upper limb: Secondary | ICD-10-CM

## 2019-06-26 LAB — POCT URINALYSIS DIP (DEVICE)
Bilirubin Urine: NEGATIVE
Glucose, UA: NEGATIVE mg/dL
Ketones, ur: NEGATIVE mg/dL
Leukocytes,Ua: NEGATIVE
Nitrite: NEGATIVE
Protein, ur: NEGATIVE mg/dL
Specific Gravity, Urine: <=1.005 (ref 1.005–1.030)
Urobilinogen, UA: 0.2 mg/dL (ref 0.0–1.0)
pH: 6 (ref 5.0–8.0)

## 2019-06-26 LAB — URIC ACID: Uric Acid, Serum: 3.6 mg/dL (ref 2.5–7.1)

## 2019-06-26 MED ORDER — IBUPROFEN 600 MG PO TABS: 600.0000 mg | ORAL_TABLET | Freq: Four times a day (QID) | ORAL | 0 refills | Status: DC | PRN

## 2019-06-26 NOTE — ED Provider Notes (Signed)
HPI  SUBJECTIVE:  [name] is a 54 y.o. female who presents with 2 issues.  First, she reports a mass in the proximal phalanx of her right middle finger starting 2 months ago.  States that she had a cut at the PIP of the middle finger, however this has healed without any problems.  Denies direct  trauma  in the area of the mass.  She is unable to characterize the pain. states it is intermittent, present only with palpation.  She states that she has tried lancing it twice and has gotten yellow drainage from it.  She is unable to tell me if it was purulent or clear.  No swelling, distal numbness or tingling, limitation of motion of her finger.  No fevers.  She states that it is getting bigger.  No overlying erythema.  She tried puncturing it and a "[place] medicine" without improvement in her symptoms.  Symptoms are worse with palpation.  No alleviating factors.  She is right-handed.  Second, she reports 2 episodes of urethral heaviness/pressure with urination over the past week.  No dysuria, urgency, frequency, cloudy odorous urine, hematuria.  This does not happen every time she urinates.  No abdominal, back, pelvic pain.  No nausea, vomiting, fevers.  No vaginal discharge bleeding odor rash itching.  She states that she has not been sexually active in "a long time".  She has never had symptoms like this before.  Past medical history negative for gout, osteoarthritis, pseudogout, rheumatoid arthritis, UTI, pyonephritis, nephrolithiasis, BV, yeast infections, chronic kidney disease, diabetes, hypertension, smoking.  PMD: [facility].    Past Medical History:  Diagnosis Date  . Allergy   . Chest pain 03/06/2016      . Dizziness 03/08/2015  . GERD (gastroesophageal reflux disease)   . Hyperlipidemia    BORDERLINE  . Left flank pain 11/29/2015    Past Surgical History:  Procedure Laterality Date  . CESAREAN SECTION  07/1999   x1  . OOPHORECTOMY Left 2009   Left Ovarian Mass     Family History  Problem Relation Age of Onset  . Diabetes Father   . Colon cancer Neg Hx   . Rectal cancer Neg Hx   . Stomach cancer Neg Hx     Social History   Tobacco Use  . Smoking status: Never Smoker  . Smokeless tobacco: Never Used  Substance Use Topics  . Alcohol use: No    Alcohol/week: 0.0 standard drinks  . Drug use: No    No current facility-administered medications for this encounter.  Current Outpatient Medications:  .  ibuprofen (ADVIL) 600 MG tablet, Take 1 tablet (600 mg total) by mouth every 6 (six) hours as needed., Disp: 30 tablet, Rfl: 0 .  omeprazole (PRILOSEC) 20 MG capsule, Take 1 capsule (20 mg total) by mouth daily., Disp: 90 capsule, Rfl: 3  Allergies  Allergen Reactions  . Beef-Derived Products Hives     ROS  As noted in HPI.   Physical Exam  BP 109/69 (BP Location: Left Arm)   Pulse 67   Temp 98.3 F (36.8 C) (Oral)   Resp 16   LMP 06/28/2012   SpO2 100%   Constitutional: Well developed, well nourished, no acute distress Eyes:  EOMI, conjunctiva normal bilaterally HENT: Normocephalic, atraumatic,mucus membranes moist Respiratory: Normal inspiratory effort Cardiovascular: Normal rate GI: nondistended no suprapubic, flank tenderness Back: No CVAT,  skin: No rash, skin intact Musculoskeletal: Mildly tender yellowish mass in the middle of  the middle phalanx of the right middle finger.  No swelling erythema tenderness over the DIP, PIP.  No tenderness along the flexor tendons.  No expressible purulence.  No overlying erythema.  Sensation distally grossly intact.  FDP FDS intact.  Cap refill less than 2 seconds.      Neurologic: Alert & oriented x 3, no focal neuro deficits Psychiatric: Speech and behavior appropriate   ED Course   Medications - No data to display  Orders Placed This Encounter  Procedures  . Urine culture    Standing Status:   Standing    Number of Occurrences:   1  . DG Finger Middle Right     Standing Status:   Standing    Number of Occurrences:   1    Order Specific Question:   Reason for Exam (SYMPTOM  OR DIAGNOSIS REQUIRED)    Answer:   mass ulnar middle phalanx r/o psuedogout, tophi  . Uric acid    Standing Status:   Standing    Number of Occurrences:   1  . POCT urinalysis dip (device)    Standing Status:   Standing    Number of Occurrences:   1    Results for orders placed or performed during the hospital encounter of 06/26/19 (from the past 24 hour(s))  POCT urinalysis dip (device)     Status: Abnormal   Collection Time: 06/26/19  2:56 PM  Result Value Ref Range   Glucose, UA NEGATIVE NEGATIVE mg/dL   Bilirubin Urine NEGATIVE NEGATIVE   Ketones, ur NEGATIVE NEGATIVE mg/dL   Specific Gravity, Urine <=1.005 1.005 - 1.030   Hgb urine dipstick TRACE (A) NEGATIVE   pH 6.0 5.0 - 8.0   Protein, ur NEGATIVE NEGATIVE mg/dL   Urobilinogen, UA 0.2 0.0 - 1.0 mg/dL   Nitrite NEGATIVE NEGATIVE   Leukocytes,Ua NEGATIVE NEGATIVE  Uric acid     Status: None   Collection Time: 06/26/19  3:19 PM  Result Value Ref Range   Uric Acid, Serum 3.6 2.5 - 7.1 mg/dL   DG Finger Middle Right  Result Date: 06/26/2019 CLINICAL DATA:  Pain discoloration of the third digit EXAM: RIGHT MIDDLE FINGER 2+V COMPARISON:  None. FINDINGS: Soft tissue swelling is noted adjacent to the middle phalanx. No bony abnormality is seen. No calcifications are noted. IMPRESSION: Soft tissue changes without bony abnormality. Electronically Signed   By: Inez Catalina M.D.   On: 06/26/2019 15:56    ED Clinical Impression  1. Mass of finger of right hand   2. Hematuria, unspecified type      ED Assessment/Plan  1.  Mass middle right finger.  Could be gout, pseudogout although less likely given location, it is not in the joint. could be callus, cyst.  Doubt infection given duration of symptoms of 2 months.  Will check uric acid and x-ray.  Will call daughter Sharyn Lull at [phone] if uric acid is elevated.,   Will consider starting her colchicine if elevated.  BUN/creatinine on May 17, 2019 was normal.   will have her try 600 mg ibuprofen combined with 1 g of Tylenol 3-4 times a day as needed for pain,  follow-up with Dr. Stann Mainland, hand surgeon on-call, at [facility] if it persists.  Reviewed imaging independently.  Soft tissue swelling adjacent to the middle phalanx.  No calcifications or bony abnormalities.  See radiology report for full details.  Uric acid normal.   2.  Hematuria.  Does not sound like a urinary tract infection.  Could be bladder or urethral spasm.  UA is negative for nitrites leukocytes.  Sending urine off for culture to confirm absence of UTI.  Will not initiate antibiotics today.  Differential for hematuria is vast, will have her have this repeated by her PMD in 1 week.  Doubt obstructing nephrolithiasis, GU infection.  Discussed pending labs, imaging, MDM, treatment plan, and plan for follow-up with patient. Discussed sn/sx that should prompt return to the ED. patient agrees with plan.   Meds ordered this encounter  Medications  . ibuprofen (ADVIL) 600 MG tablet    Sig: Take 1 tablet (600 mg total) by mouth every 6 (six) hours as needed.    Dispense:  30 tablet    Refill:  0    *This clinic note was created using [employer]. Therefore, there may be occasional mistakes despite careful proofreading.   ?    Melynda Ripple, MD 06/27/19 [phone]

## 2019-06-26 NOTE — Discharge Instructions (Addendum)
Your x-ray showed that you have some soft tissue swelling but there is nothing wrong with the bone.  There did not seem to be any foreign bodies in this lump. You can try ibuprofen 600 mg combined with 1 g of Tylenol 3-4 times a day as needed for pain.  You can try some warm compresses.  If this does not help, then follow-up with Dr. Stann Mainland at [facility] for further evaluation and management.  I will call your daughter Sharyn Lull only if your uric acid is abnormal.  If it is normal, I will not contact you.  If it is abnormal, then I will consider starting you on a medicine called colchicine.  Follow-up with your doctor in 1 week to have your urine rechecked.  I have sent your urine off for culture today to make sure that you have a UTI.  We will contact you and call in the appropriate antibiotics if your urine culture comes back positive for urinary tract infection.

## 2019-06-26 NOTE — ED Triage Notes (Signed)
Patient presents to Urgent Care with complaints of right middle finger pain near the middle joint since 1-2 months ago and she feels like there is something in there. Patient reports she also thinks she may have a uti, has pressure w/ urination.

## 2019-06-27 LAB — URINE CULTURE: Culture: NO GROWTH

## 2019-07-04 ENCOUNTER — Other Ambulatory Visit: Payer: Self-pay

## 2019-07-04 ENCOUNTER — Ambulatory Visit (INDEPENDENT_AMBULATORY_CARE_PROVIDER_SITE_OTHER): Payer: 59 | Admitting: Student in an Organized Health Care Education/Training Program

## 2019-07-04 VITALS — BP 120/72 | HR 57 | Wt 101.2 lb

## 2019-07-04 DIAGNOSIS — Z8701 Personal history of pneumonia (recurrent): Secondary | ICD-10-CM

## 2019-07-04 DIAGNOSIS — R319 Hematuria, unspecified: Secondary | ICD-10-CM

## 2019-07-04 LAB — POCT URINALYSIS DIP (MANUAL ENTRY)
Bilirubin, UA: NEGATIVE
Glucose, UA: NEGATIVE mg/dL
Ketones, POC UA: NEGATIVE mg/dL
Leukocytes, UA: NEGATIVE
Nitrite, UA: NEGATIVE
Protein Ur, POC: NEGATIVE mg/dL
Spec Grav, UA: 1.015 (ref 1.010–1.025)
Urobilinogen, UA: 0.2 E.U./dL
pH, UA: 7 (ref 5.0–8.0)

## 2019-07-04 LAB — POCT UA - MICROSCOPIC ONLY

## 2019-07-04 NOTE — Progress Notes (Signed)
    SUBJECTIVE:   CHIEF COMPLAINT / HPI: f/u for hemoglobinuria   Patient seen in UC 2/14 for finger mass and evaluation for UTI which was negative but she did have trace hgb in urine. Patient continues to have sensation of fullness after voiding.  Denies gross hematuria, dysuria.  Does not have any sensation of prolapsed organs. Patient states that sometimes she will have pimples on her external genitalia which resolved spontaneously and she does not currently have any.  Patient also complains of chills at night which has been ongoing for several months but no fevers.  No weight loss.  Never smoker. Patient states that at her urology appointment in 2017 her work-up was inconclusive and they told her that her bladder was too thick but did not recommend any further work-up.  Also history of left oophorectomy and C-section.  Patient treated for pneumonia in November and denies any respiratory complaints at this time but continues to have chills at night so would like her lungs to be evaluated today.  PERTINENT  PMH / PSH: h/o hematuria and seen by urology 02/2016 and diagnosed with bladder wall thickening. No further workup was planned at that time.  History of left oophorectomy in 2009.  OBJECTIVE:   BP 120/72   Pulse (!) 57   Wt 101 lb 3.2 oz (45.9 kg)   LMP 06/28/2012   SpO2 99%   BMI 21.15 kg/m   General: NAD, pleasant, able to participate in exam Respiratory: Normal work of breathing, negative for dyspnea, clear breath sounds in all fields. HEENT: Positive for bilateral nontender cervical lymphadenopathy Pelvic:  External exam negative for lesions or prolapse. Negative cough test. Negative for abnormal vaginal discharge.  Cervix without lesions. Tender to palpation of pelvic adnexa on right. Abdomen: soft, nondistended, no hepatic or splenomegaly, +BS Extremities: no edema or cyanosis. WWP. Skin: warm and dry, no rashes noted Neuro: alert and oriented x4, no focal deficits Psych:  Normal affect and mood   ASSESSMENT/PLAN:   Hematuria Rare blood cells on microscopic with h/o same. Has feeling of pelvic fullness associated with emptying her bladder and also has right adnexal pain. Low suspicion for glomerular pathology due to microscopic exam negative.  Concern for urinary tract/bladder malignancy.  Ordering patient CT abdomen pelvis with IV contrast for urography.  Ordered CMP today to evaluate kidney function as well as CBC.  History of pneumonia Negative for respiratory symptoms.  Lung exam clear.  Clinically recovered from infection     Richarda Osmond, [place]

## 2019-07-04 NOTE — Patient Instructions (Signed)
It was a pleasure to see you today!  To summarize our discussion for this visit:  You still have a small amount of blood in your urine. The next step is to get a CT scan of your pelvis to try to find a cause.   Please go to your appointment time on this paperwork.    Call the clinic at [phone] if your symptoms worsen or you have any concerns.   Thank you for allowing me to take part in your care,  Dr. Doristine Mango

## 2019-07-04 NOTE — Assessment & Plan Note (Signed)
Negative for respiratory symptoms.  Lung exam clear.  Clinically recovered from infection

## 2019-07-04 NOTE — Assessment & Plan Note (Addendum)
Rare blood cells on microscopic with h/o same. Has feeling of pelvic fullness associated with emptying her bladder and also has right adnexal pain. Low suspicion for glomerular pathology due to microscopic exam negative.  Concern for urinary tract/bladder malignancy.  Ordering patient CT abdomen pelvis with IV contrast for urography.  Ordered CMP today to evaluate kidney function as well as CBC.

## 2019-07-05 LAB — CBC
Hematocrit: 38 % (ref 34.0–46.6)
Hemoglobin: 13.1 g/dL (ref 11.1–15.9)
MCH: 32.5 pg (ref 26.6–33.0)
MCHC: 34.5 g/dL (ref 31.5–35.7)
MCV: 94 fL (ref 79–97)
Platelets: 218 10*3/uL (ref 150–450)
RBC: 4.03 x10E6/uL (ref 3.77–5.28)
RDW: 11.4 % — ABNORMAL LOW (ref 11.7–15.4)
WBC: 4.8 10*3/uL (ref 3.4–10.8)

## 2019-07-05 LAB — COMPREHENSIVE METABOLIC PANEL
ALT: 25 IU/L (ref 0–32)
AST: 29 IU/L (ref 0–40)
Albumin/Globulin Ratio: 1.9 (ref 1.2–2.2)
Albumin: 4.5 g/dL (ref 3.8–4.9)
Alkaline Phosphatase: 81 IU/L (ref 39–117)
BUN/Creatinine Ratio: 17 (ref 9–23)
BUN: 11 mg/dL (ref 6–24)
Bilirubin Total: 0.5 mg/dL (ref 0.0–1.2)
CO2: 26 mmol/L (ref 20–29)
Calcium: 9.8 mg/dL (ref 8.7–10.2)
Chloride: 100 mmol/L (ref 96–106)
Creatinine, Ser: 0.66 mg/dL (ref 0.57–1.00)
GFR calc Af Amer: 115 mL/min/{1.73_m2} (ref >59)
GFR calc non Af Amer: 100 mL/min/{1.73_m2} (ref >59)
Globulin, Total: 2.4 g/dL (ref 1.5–4.5)
Glucose: 78 mg/dL (ref 65–99)
Potassium: 4.5 mmol/L (ref 3.5–5.2)
Sodium: 139 mmol/L (ref 134–144)
Total Protein: 6.9 g/dL (ref 6.0–8.5)

## 2019-07-08 ENCOUNTER — Telehealth: Payer: Self-pay

## 2019-07-08 NOTE — Telephone Encounter (Signed)
Sharyn Lull, patients daughter, calls nurse line stating her mother had an imaging apt for 3/1, however it was cancelled because insurance has not given the OK to authorize. I apologized and told Sharyn Lull sometimes we need more notice. I will forward to [place]. Please call Sharyn Lull [phone], once authorized, so they can call and reschedule.

## 2019-07-11 ENCOUNTER — Ambulatory Visit (HOSPITAL_COMMUNITY): Payer: 59

## 2019-08-30 ENCOUNTER — Telehealth: Payer: Self-pay | Admitting: Family Medicine

## 2019-08-30 NOTE — Telephone Encounter (Signed)
Pt. Called stating she received a call yesterday she thinks was from Dr. Ardelia Mems; no info found in chart review   Would like for doctor to call her at [phone]

## 2019-11-07 ENCOUNTER — Other Ambulatory Visit: Payer: Self-pay | Admitting: Obstetrics and Gynecology

## 2019-11-08 ENCOUNTER — Other Ambulatory Visit: Payer: Self-pay | Admitting: Family Medicine

## 2019-11-08 DIAGNOSIS — Z1231 Encounter for screening mammogram for malignant neoplasm of breast: Secondary | ICD-10-CM

## 2019-12-12 ENCOUNTER — Ambulatory Visit
Admission: RE | Admit: 2019-12-12 | Discharge: 2019-12-12 | Disposition: A | Discharge status: Home / Self Care | Payer: 59 | Source: Ambulatory Visit | Attending: Family Medicine | Admitting: Family Medicine

## 2019-12-12 ENCOUNTER — Other Ambulatory Visit: Payer: Self-pay

## 2019-12-12 DIAGNOSIS — Z1231 Encounter for screening mammogram for malignant neoplasm of breast: Secondary | ICD-10-CM

## 2019-12-13 ENCOUNTER — Other Ambulatory Visit: Payer: Self-pay

## 2019-12-13 ENCOUNTER — Ambulatory Visit (INDEPENDENT_AMBULATORY_CARE_PROVIDER_SITE_OTHER): Payer: 59 | Admitting: Family Medicine

## 2019-12-13 VITALS — BP 120/80 | HR 64 | Wt 100.0 lb

## 2019-12-13 DIAGNOSIS — R319 Hematuria, unspecified: Secondary | ICD-10-CM | POA: Diagnosis not present

## 2019-12-13 DIAGNOSIS — K625 Hemorrhage of anus and rectum: Secondary | ICD-10-CM | POA: Diagnosis not present

## 2019-12-13 DIAGNOSIS — K921 Melena: Secondary | ICD-10-CM

## 2019-12-13 LAB — POCT URINALYSIS DIP (MANUAL ENTRY)
Bilirubin, UA: NEGATIVE
Glucose, UA: NEGATIVE mg/dL
Ketones, POC UA: NEGATIVE mg/dL
Leukocytes, UA: NEGATIVE
Nitrite, UA: NEGATIVE
Protein Ur, POC: NEGATIVE mg/dL
Spec Grav, UA: 1.01 (ref 1.010–1.025)
Urobilinogen, UA: 0.2 E.U./dL
pH, UA: 7 (ref 5.0–8.0)

## 2019-12-13 LAB — POCT HEMOGLOBIN: Hemoglobin: 12.7 g/dL (ref 11–14.6)

## 2019-12-13 MED ORDER — TRIAMCINOLONE ACETONIDE 0.1 % EX CREA: 1.0000 "application " | TOPICAL_CREAM | Freq: Two times a day (BID) | CUTANEOUS | 0 refills | Status: DC

## 2019-12-13 MED ORDER — HYDROCORTISONE (PERIANAL) 2.5 % EX CREA: 1.0000 "application " | TOPICAL_CREAM | Freq: Two times a day (BID) | CUTANEOUS | 0 refills | Status: DC

## 2019-12-13 NOTE — Patient Instructions (Addendum)
It was great to see you again today!  For hemorrhoids - apply proctozone cream up to twice daily. See handout below.  For hand - apply triamcinolone twice daily until it heals.  Referring to GI doctor  Come back if symptoms not improving or if getting worse  Be well, Dr. Ardelia Mems    B?nh tr? Hemorrhoids B?nh tr? l cc t?nh m?ch ? trong ho?c xung quanh tr?c trng ho?c h?u mn b? s?ng ln. C hai lo?i b?nh tr?:  B?nh tr? n?i. B?nh x?y ra v?i nh?ng t?nh m?ch ? ngay bn trong tr?c trng. Cc m?ch mu c th? tr?i ra ngoi v b? kch ?ng v ?au ??n.  B?nh tr? ngo?i. B?nh x?y ra ? nh?ng t?nh m?ch bn ngoi h?u mn v c th? s? th?y nh? m?t c?c s?ng ?au ho?c c?ng g?n h?u mn. H?u h?t cc bi tr? khng gy v?n ?? nghim tr?ng v c th? ???c x? tr b?ng cch ?i?u tr? t?i nh nh? thay ??i ch? ?? ?n v thay ??i l?i s?ng. N?u ?i?u tr? t?i nh khng lm gi?m tri?u ch?ng, c th? c?n th?c hi?n cc th? thu?t lm co nh? ho?c c?t bi tr?[place]our[place] Sledge nhn g gy ra? B?nh ny do p l?c t?ng ln ? khu v?c h?u mn gy ra. p l?c ny c th? do nhi?u nguyn nhn khc nhau, bao g?m:  To bn.  Ph?i r?n m?nh ?? ??i ti?n.  Tiu ch?y.  [place]ang [place].  Bo ph.  Ng?i trong th?i gian di.  Nng v?t n?ng ho?c ho?t ??ng khc lm qu v? g?ng s?c.  Quan h? tnh d?c qua h?u mn.  ??p xe trong m?t th?i gian di. Cc d?u hi?u ho?c tri?u ch?ng l g? Nh?ng tri?u ch?ng c?a tnh tr?ng ny bao g?m:  ?au.  Ng?a ho?c kch ?ng h?u mn.  Ch?y mu tr?c trng.  R r? phn (phn).  S?ng h?u mn.  M?t ho?c nhi?u u c?c xung quanh h?u mn. Ch?n ?on tnh tr?ng ny nh? th? no? Tnh tr?ng ny th??ng c th? ???c ch?n ?on b?ng cch ki?m tra qua tr?c quan. Cc ki?m tra ho?c xt nghi?m khc c?ng c th? ???c th?c hi?n, ch?ng h?n nh?:  M?t l?n khm bao g?m khm vng tr?c trng b?ng tay ?eo g?ng (khm tr?c trng b?ng ngn tay).  M?t l?n khm h?u mn ???c th?c hi?n b?ng cch s? d?ng m?t ?ng nh? (?ng soi h?u mn).  Xt  nghi?m mu, n?u qu v? b? m?t m?t l??ng mu ?ng k?.  M?t ki?m tra xem bn trong ??i trng b?ng cch s? d?ng m?t ?ng m?m c camera ? ??u (soi ??i trng sigma ho?c n?i soi ??i trng). Tnh tr?ng ny ???c ?i?u tr? nh? th? no? Tnh tr?ng ny th??ng c th? ???c ?i?u tr? t?i nh. Tuy nhin, c th? c?n lm nhi?u lo?i th? thu?t khc nhau n?u vi?c thay ??i ch? ?? ?n u?ng, thay ??i l?i s?ng v cc bi?n php ?i?u tr? khc t?i nh khng lm gi?m tri?u ch?ng. Nh?ng th? thu?t ny c th? gip lm cho cc bi tr? nh? h?n ho?c c?t b? chng hon ton. M?t s? th? thu?t lin quan ??n ph?u thu?t v m?t s? khc th khng. Cc th? thu?t ph? bi?n bao g?m:  Th?t tr? b?ng vng cao su. Cc vng cao su ???c ??t ? g?c cc bi tr? ?? ng?t dng mu cung c?p ??n bi tr?Marland Kitchen  Li?u php x? ha. Thu?c ???c tim  vo bi tr? ?? lm cc bi tr? ? co nh? l?i.  Lm ?ng b?ng tia h?ng ngo?i. M?t lo?i n?ng l??ng nh? ???c s? d?ng ?? lo?i b? tr?.  Ph?u thu?t c?t tr?. Bi tr? ???c c?t b? b?ng ph?u thu?t v cc t?nh m?ch cung c?p mu ??n cc bi tr? ny ???c th?t l?i.  Ph?u thu?t c?t tr? b?ng k?p. Bc s? ph?u thu?t s? ghim chn c?a bi tr? vo thnh tr?c trng. Tun th? nh?ng h??ng d?n ny ? nh: ?n v u?ng   ?n th?c ?n c nhi?u ch?t x? nh? ng? c?c nguyn h?t, cc lo?i ??u, h?t, tri cy v rau.  Hy h?i chuyn gia ch?m [place] s?c kh?e v? vi?c dng cc s?n ph?m c thm ch?t x? (th?c ph?m b? sung ch?t x?).  Gi?m l??ng ch?t bo trong ch? ?? ?n c?a qu v?. Qu v? c th? lm ?i?u ny b?ng cch ?n cc s?n ph?m s?a t bo, ?n t th?t ?? h?n v trnh cc th?c ph?m ch? bi?n s?n.  U?ng ?? n??c ?? gi? cho n??c ti?u c mu vng nh?t. X? tr ?au v s?ng n?   Ngm mng b?ng n??c ?m trong 20 pht, 3-4 l?n m?i ngy ?? gi?m ?au v gi?m c?m gic kh ch?u. Qu v? c th? th?c hi?n ?i?u ny trong b?n t?m ho?c s? d?ng ch?u ngm mng l?u ??ng v?a v?i b?n c?u.  N?u ???c ch? d?n, ch??m ? l?nh vo vng b? ?nh h??ng. S? d?ng cc ti ch??m l?nh gi?a nh?ng l?n  t?m ng?i c th? c tc d?ng. ? Cho ? l?nh vo ti ni lng. ? ?? kh?n t?m ? gi?a da v ti ch??m. ? Ch??m ? l?nh trong 20 pht, 2-3 l?n m?i ngy. H??ng d?n chung  Ch? s? d?ng thu?c khng k ??n v thu?c k ??n theo ch? d?n c?a chuyn gia ch?m [place] s?c kh?e.  S? d?ng thu?c d?ng kem ho?c vin ??n theo ch? d?n.  T?p th? d?c th??ng xuyn. Hy h?i chuyn gia ch?m [place] s?c kh?e v? th?i l??ng v lo?i bi t?p no an ton cho qu v?. Ni chung, qu v? nn t?p th? d?c c c??ng ?? v?a ph?i trong t nh?t l 30 pht vo h?u h?t cc ngy trong tu?n (150 pht m?i tu?n). Vi?c ny c th? bao g?m cc ho?t ??ng nh? ?i b?, ??p xe, ho?c yoga.  Vo nh v? sinh khi qu v? bu?n ?i ??i ti?n. Khng ch? ??i.  Trnh r?n m?nh khi ?i ??i ti?n.  Gi? cho vng h?u mn kh v s?ch. S? d?ng gi?y v? sinh ??t ho?c kh?n gi?y v? sinh ?m sau khi ?i ??i ti?n.  Khng ng?i lu trong nh v? sinh. Vi?c ny lm t?ng ? mu v ?au.  Tun th? t?t c? cc l?n khm theo di theo ch? d?n c?a chuyn gia ch?m [place] s?c kh?e. ?i?u ny c vai tr quan tr?ng. Hy lin l?c v?i chuyn gia ch?m [place] s?c kh?e n?u qu v? b?:  ?au v s?ng t?ng ln m khng ki?m sot ???c b?ng ?i?u tr? ho?c b?ng thu?c.  ??i ti?n kh kh?n, ho?c qu v? khng th? ??i ti?n ???c.  ?au ho?c vim ? ngoi khu v?c c?a cc bi tr?Tasia Catchings c?u tr? gip ngay l?p t?c n?u qu v? c:  Ch?y mu khng ki?m sot ???c ? tr?c trng. Tm t?t  B?nh tr? l cc t?nh m?ch ? trong ho?c xung quanh tr?c trng ho?c h?u  mn b? s?ng ln.  H?u h?t cc bi tr? c th? ???c x? tr b?ng cch ?i?u tr? t?i nh nh? thay ??i ch? ?? ?n v thay ??i l?i s?ng.  Ngm mng b?ng n??c ?m c th? gip gi?m ?au v gi?m c?m gic kh ch?u.  Trong cc tr??ng h?p n?ng, th? thu?t ho?c ph?u thu?t c th? ???c th?c hi?n ?? thu nh? ho?c c?t b? bi tr?Tera Mater tin ny khng nh?m m?c ?ch thay th? cho l?i khuyn m chuyn gia ch?m [place] s?c kh?e ni v?i qu v?. Hy b?o ??m qu v? ph?i th?o lu?n b?t k? v?n ?? g m qu v? c v?i  chuyn gia ch?m [place] s?c kh?e c?a qu v?. Document Revised: 11/05/2017 Document Reviewed: 11/05/2017 Elsevier Patient Education  2020 [employer].

## 2019-12-13 NOTE — Progress Notes (Signed)
  Date of Visit: 12/13/2019   SUBJECTIVE:   HPI:  [name] presents today for follow up. [place] interpreter utilized during this visit.   Urine - feels pressure when she urinates. No visible blood in her urine. Previously had urine microscopy showing rare RBC, and trace lysed blood on dipstick. CT had been attempted but was not done due to insurance issues. Patient is now following up to discuss getting CT scheduled.  Blood in stool - 3 days ago she had blood on the toilet paper when she wiped. Has seen a drop of blood in toilet before. Last colonoscopy in March 2016 and was completely normal, no biopsies taken. Last night had a little bit of epigastric discomfort, also sometimes has mild heaviness in her R lower quadrant. Endorses some anal itching as well. 2 weeks ago had an episode of heartburn, took omeprazole with relief. Also had an isolated episode of sweating at night then.  Hand issue - has had peeling on her hand for several weeks. Tried ketoconazole, triple antibiotic, and cocoa butter without any relief. Itches some.   OBJECTIVE:   BP 120/80   Pulse 64   Wt 100 lb (45.4 kg)   LMP 06/28/2012   SpO2 99%   BMI 20.90 kg/m  Gen: no acute distress, pleasant, cooperative HEENT: normocephalic, atraumatic  Heart: regular rate and rhythm, no murmur Lungs: clear to auscultation bilaterally, normal work of breathing  Abdomen: soft, nontender to palpation, normoactive bowel sounds, no masses or organomegaly Neuro: alert, speech normal, grossly nonfocal Rectal: normal external anus. Anoscopy demonstrates small blue hemorrhoid just inside of anal sphincter. No other lesions. No blood visible in rectum.  Skin: area of peeling and flaking skin on R first digit and R thenar eminence  With underlying erythema. No warmth or tenderness. No drainage, no vesicles.      ASSESSMENT/PLAN:   Health maintenance:  -has received both COVID vaccines  Blood in stool Suspect due to hemorrhoid  seen on anoscopy. Colonoscopy 5 years ago was normal. Plan: - hydrocortisone rectal cream to treat hemorrhoid - gave handout on hemorrhoids for patient to review - POC Hgb today, normal - refer back to GI for evaluation, may warrant sooner colonoscopy  Hematuria Again with trace-lysed blood on urine dipstick today. Unfortunately we have been unable to do urine microscopy due to an issue in the lab. Prior urine micros showed "rare" RBC. Patient very concerned she could have cancer, states her sister in law just died of cancer. She would prefer to get CT abdomen/pelvis to ensure nothing is wrong. Will order CT.  Skin peeling on hand Suspect possible dyshydrotic eczema, unclear of dx. Trial of topical corticosteroid (trimacinolone). If not improving, consider biopsy vs referral to dermatology.  [name]. [name], [name]

## 2019-12-13 NOTE — Assessment & Plan Note (Signed)
Again with trace-lysed blood on urine dipstick today. Unfortunately we have been unable to do urine microscopy due to an issue in the lab. Prior urine micros showed "rare" RBC. Patient very concerned she could have cancer, states her sister in law just died of cancer. She would prefer to get CT abdomen/pelvis to ensure nothing is wrong. Will order CT.

## 2019-12-13 NOTE — Assessment & Plan Note (Signed)
Suspect due to hemorrhoid seen on anoscopy. Colonoscopy 5 years ago was normal. Plan: - hydrocortisone rectal cream to treat hemorrhoid - gave handout on hemorrhoids for patient to review - POC Hgb today, normal - refer back to GI for evaluation, may warrant sooner colonoscopy

## 2019-12-16 ENCOUNTER — Encounter: Payer: Self-pay | Admitting: Family Medicine

## 2019-12-19 ENCOUNTER — Telehealth: Payer: Self-pay | Admitting: *Deleted

## 2019-12-19 NOTE — Telephone Encounter (Signed)
-----   Message from Leeanne Rio, MD sent at 12/13/2019 10:28 AM EDT ----- Please schedule for CT abdomen/pelvis and contact patient with an appointment, thanks! Leeanne Rio, MD

## 2019-12-19 NOTE — Telephone Encounter (Signed)
Pt scheduled and informed. Mekiah Cambridge, CMA  

## 2019-12-20 ENCOUNTER — Encounter: Payer: Self-pay | Admitting: Gastroenterology

## 2020-01-03 ENCOUNTER — Other Ambulatory Visit: Payer: Self-pay

## 2020-01-03 ENCOUNTER — Ambulatory Visit (HOSPITAL_COMMUNITY)
Admission: RE | Admit: 2020-01-03 | Discharge: 2020-01-03 | Disposition: A | Discharge status: Home / Self Care | Payer: 59 | Source: Ambulatory Visit | Attending: Family Medicine | Admitting: Family Medicine

## 2020-01-03 DIAGNOSIS — R319 Hematuria, unspecified: Secondary | ICD-10-CM | POA: Diagnosis not present

## 2020-01-03 MED ORDER — IOHEXOL 300 MG/ML  SOLN
100.0000 mL | Freq: Once | INTRAMUSCULAR | Status: AC | PRN
  Administered 2020-01-03: 100 mL via INTRAVENOUS

## 2020-01-04 ENCOUNTER — Encounter: Payer: Self-pay | Admitting: Family Medicine

## 2020-01-25 ENCOUNTER — Ambulatory Visit (INDEPENDENT_AMBULATORY_CARE_PROVIDER_SITE_OTHER): Payer: 59 | Admitting: Family Medicine

## 2020-01-25 ENCOUNTER — Other Ambulatory Visit: Payer: Self-pay

## 2020-01-25 DIAGNOSIS — R234 Changes in skin texture: Secondary | ICD-10-CM

## 2020-01-25 DIAGNOSIS — K649 Unspecified hemorrhoids: Secondary | ICD-10-CM | POA: Diagnosis not present

## 2020-01-25 MED ORDER — BENZOCAINE 20 % RE OINT: TOPICAL_OINTMENT | RECTAL | 0 refills | Status: DC | PRN

## 2020-01-25 MED ORDER — HYDROCORTISONE (PERIANAL) 2.5 % EX CREA: 1.0000 "application " | TOPICAL_CREAM | Freq: Two times a day (BID) | CUTANEOUS | 0 refills | Status: DC

## 2020-01-25 MED ORDER — CLOBETASOL PROP EMOLLIENT BASE 0.05 % EX CREA: TOPICAL_CREAM | CUTANEOUS | 0 refills | Status: AC

## 2020-01-25 NOTE — Patient Instructions (Signed)
R?t vui ???c g?p b?n hm nay. Ti ? k ??n m?t lo?i kem khc ?? tr? ng?a tr?c trng. Ti ? k ??n m?t lo?i kem bi tay khc. khng s? d?ng kem ny trn da bnh th??ng. s? d?ng ?i?u ny trong 2 tu?n v theo di v?i bc s? c?a b?n. ng?ng s? d?ng kem tay khc

## 2020-01-25 NOTE — Progress Notes (Signed)
    SUBJECTIVE:   CHIEF COMPLAINT / HPI:   A vietnamese speaking interpretor was used for this encounter.   [name] is a 54 yr old female who presents today for hemorrhoids and skin peeling on hands   Hemorrhoids  Pt was prescribed hydrocortisone cream at last clinic visit for internal hemorrhoids however this provided little relief.  Patient endorses rectal itching. Denies rectal bleeding, pain on defecation, diarrhea or constipation. Pt is eating better so having better bowel movements.  Skin peeling on hand  Patient was seen in the clinic for dyshidrotic eczema and was prescribed triamcinolone cream twice a day.  It has improved on the right hand however rash has now spread to the left hand.  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 She tries to wear gloves. They are cracked and painful at times. She would like another.   PERTINENT  PMH / PSH: GERD, eustachian tube dysfunction, HLD   OBJECTIVE:   BP 122/78   Pulse 66   Ht 4\' 10"  (1.473 m)   Wt 99 lb 9.6 oz (45.2 kg)   LMP 06/28/2012 (Approximate)   SpO2 100%   BMI 20.82 kg/m    General: Alert, cooperative, no acute distress Pulm: Normal WOB Cardiac: well perfused  Extremities: No peripheral edema. Warm/ well perfused. Strong radial pulse Neuro: Cranial nerves grossly intact  Skin:      Erythematous, peeling rash over right thenar eminence and right thumb.  Proved from previous photos on chart review.  Similar new rash over left thumb. No warmth, tenderness, purulent discharge or vesicles.  Anoscopy chaperoned by lab tech. Healthy rectal tissue. No evidence of hemorrhoids. No blood in rectum.   ASSESSMENT/PLAN:   Hemorrhoids Some improvement with rectal hydrocortisone cream. Anoscopy normal today. Prescribed benzocaine rectal ointment. Recommended to continue the hydrocoritosne cream as follow up with PCP if persistent symptoms.   Peeling skin Improving with triamcinolone cream over  right thenar eminence and right thumb however it has spread to left thumb. Low suspicion for infectious etiology. More likely to be dyshidrotic eczema. Prescribed clobetasol ointment today as pt was particularly concerned about it worsening. Recommended pt stops using triamcinolone. Recommended to use only over affected areas and not over healthy skin. Follow up with PCP.     Lattie Haw, MD PGY2 [place]

## 2020-01-27 DIAGNOSIS — K649 Unspecified hemorrhoids: Secondary | ICD-10-CM | POA: Insufficient documentation

## 2020-01-27 NOTE — Assessment & Plan Note (Signed)
Some improvement with rectal hydrocortisone cream. Anoscopy normal today. Prescribed benzocaine rectal ointment. Recommended to continue the hydrocoritosne cream as follow up with PCP if persistent symptoms.

## 2020-02-01 DIAGNOSIS — R234 Changes in skin texture: Secondary | ICD-10-CM | POA: Insufficient documentation

## 2020-02-01 NOTE — Assessment & Plan Note (Signed)
Improving with triamcinolone cream over right thenar eminence and right thumb however it has spread to left thumb. Low suspicion for infectious etiology. More likely to be dyshidrotic eczema. Prescribed clobetasol ointment today as pt was particularly concerned about it worsening. Recommended pt stops using triamcinolone. Recommended to use only over affected areas and not over healthy skin. Follow up with PCP.

## 2020-02-21 ENCOUNTER — Encounter: Payer: Self-pay | Admitting: Gastroenterology

## 2020-02-21 ENCOUNTER — Ambulatory Visit (INDEPENDENT_AMBULATORY_CARE_PROVIDER_SITE_OTHER): Payer: 59 | Admitting: Gastroenterology

## 2020-02-21 VITALS — BP 118/76 | HR 72 | Ht <= 58 in | Wt 99.0 lb

## 2020-02-21 DIAGNOSIS — L290 Pruritus ani: Secondary | ICD-10-CM

## 2020-02-21 NOTE — Patient Instructions (Signed)
If you are age 56 or older, your body mass index should be between 23-30. Your Body mass index is 20.69 kg/m. If this is out of the aforementioned range listed, please consider follow up with your Primary Care Provider.  If you are age 14 or younger, your body mass index should be between 19-25. Your Body mass index is 20.69 kg/m. If this is out of the aformentioned range listed, please consider follow up with your Primary Care Provider.   Please use baby wipes as instructed for 2 weeks.  Please call our office in 1 month to let us know how you are feeling.  Thank you for entrusting me with your care and choosing [facility].  Dr Ardis Hughs

## 2020-02-21 NOTE — Progress Notes (Signed)
Review of pertinent gastrointestinal problems: 1. Routine risk for colon cancer.  Colonoscopy March 2016 was normal.  I recommended repeat colonoscopy at 10-year interval 2.  Functional dyspepsia, intermittent rare dysphagia.  EGD March 2018 completely normal.  HPI: This is a very pleasant 54 year old woman who was referred to me by Leeanne Rio, MD  to evaluate blood in stool, hemorrhoid.    A [place] interpreter never showed up for this appointment even though we scheduled 1.  We tried to use a professional interpreter over a mobile telemedicine audio and visual device however that was not available to [place] and so instead we tried calling professional interpreter and doing the interview over a hand-held phone which we passed between [place].  Obviously much may have been lost in translation despite our best efforts  She was having some difficulty moving her bowels she started focusing on being hydrated better difficulty improved.  She saw some blood on 2 occasions in the past month.  Her primary care physician did anoscopy exam and saw with a felt lower internal hemorrhoids.  She was given hydrocortisone cream.  The bleeding is completely stopped but she does have some itching at her bottom.  Old Data Reviewed: Point-of-care hemoglobin August 2021 was 12.7 CBC February 2021 was normal  CT scan abdomen pelvis with IV and oral contrast for hematuria was essentially normal.   Review of systems: Pertinent positive and negative review of systems were noted in the above HPI section. All other review negative.   Past Medical History:  Diagnosis Date  . Allergy   . Chest pain 03/06/2016      . Dizziness 03/08/2015  . GERD (gastroesophageal reflux disease)   . Hyperlipidemia    BORDERLINE  . Left flank pain 11/29/2015    Past Surgical History:  Procedure Laterality Date  . CESAREAN SECTION  07/1999   x1  . OOPHORECTOMY Left 2009   Left Ovarian Mass    Current Outpatient  Medications  Medication Sig Dispense Refill  . benzocaine (AMERICAINE) 20 % rectal ointment Place rectally every 3 (three) hours as needed for pain. 28.4 g 0  . hydrocortisone (PROCTOZONE-HC) 2.5 % rectal cream Place 1 application rectally 2 (two) times daily. 30 g 0  . omeprazole (PRILOSEC) 20 MG capsule Take 1 capsule (20 mg total) by mouth daily. 90 capsule 3  . triamcinolone cream (KENALOG) 0.1 % Apply 1 application topically 2 (two) times daily. To hand 30 g 0   No current facility-administered medications for this visit.    Allergies as of 02/21/2020 - Review Complete 02/21/2020  Allergen Reaction Noted  . Beef-derived products Hives 07/10/2014    Family History  Problem Relation Age of Onset  . Diabetes Father   . Colon cancer Neg Hx   . Rectal cancer Neg Hx   . Stomach cancer Neg Hx     Social History   Socioeconomic History  . Marital status: Single    Spouse name: Not on file  . Number of children: 1  . Years of education: Not on file  . Highest education level: Not on file  Occupational History  . Occupation: nail tech  Tobacco Use  . Smoking status: Never Smoker  . Smokeless tobacco: Never Used  Substance and Sexual Activity  . Alcohol use: No    Alcohol/week: 0.0 standard drinks  . Drug use: No  . Sexual activity: Not on file  Other Topics Concern  . Not on file  Social History Narrative  .  Not on file   Social Determinants of Health   Financial Resource Strain:   . Difficulty of Paying Living Expenses: Not on file  Food Insecurity:   . Worried About Charity fundraiser in the Last Year: Not on file  . Ran Out of Food in the Last Year: Not on file  Transportation Needs:   . Lack of Transportation (Medical): Not on file  . Lack of Transportation (Non-Medical): Not on file  Physical Activity:   . Days of Exercise per Week: Not on file  . Minutes of Exercise per Session: Not on file  Stress:   . Feeling of Stress : Not on file  Social  Connections:   . Frequency of Communication with Friends and Family: Not on file  . Frequency of Social Gatherings with Friends and Family: Not on file  . Attends Religious Services: Not on file  . Active Member of Clubs or Organizations: Not on file  . Attends Archivist Meetings: Not on file  . Marital Status: Not on file  Intimate Partner Violence:   . Fear of Current or Ex-Partner: Not on file  . Emotionally Abused: Not on file  . Physically Abused: Not on file  . Sexually Abused: Not on file     Physical Exam: BP 118/76   Pulse 72   Ht 4\' 10"  (1.473 m)   Wt 99 lb (44.9 kg)   LMP 06/28/2012 (Approximate)   BMI 20.69 kg/m  Constitutional: generally well-appearing Psychiatric: alert and oriented x3 Eyes: extraocular movements intact Mouth: oral pharynx moist, no lesions Neck: supple no lymphadenopathy Cardiovascular: heart regular rate and rhythm Lungs: clear to auscultation bilaterally Abdomen: soft, nontender, nondistended, no obvious ascites, no peritoneal signs, normal bowel sounds Extremities: no lower extremity edema bilaterally Skin: no lesions on visible extremities Rectal exam with female assistant in the room: Completely normal anus, no hemorrhoids, no skin tags no redness, no fissures, no mass in distal rectum on examination  Assessment and plan: 54 y.o. female with rectal itching after hemorrhoid  A [place] interpreter never showed up for this appointment even though we scheduled 1.  We tried to use a professional interpreter over a mobile telemedicine audio and visual device however that was not available to [place] and so instead we tried calling professional interpreter and doing the interview over a hand-held phone which we passed between [place].  Obviously much may have been lost in translation despite our best efforts.    I think she had difficulty with constipation which caused the hemorrhoid to flare.  Currently she has no hemorrhoids externally or on  digital rectal exam internally either.  She is still having some itching.  Her anus looks completely normal.  I recommended baby wipes for 2 weeks and for her to call back and a month report on her response.  Please see the "Patient Instructions" section for addition details about the plan.   Owens Loffler, MD [facility] 02/21/2020, 8:19 AM  Cc: Leeanne Rio, MD  Total time on date of encounter was 60  minutes (this included time spent preparing to see the patient reviewing records; obtaining and/or reviewing separately obtained history; performing a medically appropriate exam and/or evaluation; counseling and educating the patient and family if present; ordering medications, tests or procedures if applicable; and documenting clinical information in the health record).

## 2020-03-14 ENCOUNTER — Other Ambulatory Visit: Payer: Self-pay | Admitting: Family Medicine

## 2020-03-20 ENCOUNTER — Other Ambulatory Visit: Payer: Self-pay

## 2020-03-20 ENCOUNTER — Ambulatory Visit (INDEPENDENT_AMBULATORY_CARE_PROVIDER_SITE_OTHER): Payer: 59 | Admitting: Family Medicine

## 2020-03-20 ENCOUNTER — Encounter: Payer: Self-pay | Admitting: Family Medicine

## 2020-03-20 VITALS — BP 106/60 | HR 57 | Ht <= 58 in | Wt 100.6 lb

## 2020-03-20 DIAGNOSIS — K5900 Constipation, unspecified: Secondary | ICD-10-CM | POA: Diagnosis not present

## 2020-03-20 DIAGNOSIS — R319 Hematuria, unspecified: Secondary | ICD-10-CM | POA: Diagnosis not present

## 2020-03-20 DIAGNOSIS — L301 Dyshidrosis [pompholyx]: Secondary | ICD-10-CM

## 2020-03-20 DIAGNOSIS — R823 Hemoglobinuria: Secondary | ICD-10-CM | POA: Insufficient documentation

## 2020-03-20 DIAGNOSIS — Z23 Encounter for immunization: Secondary | ICD-10-CM

## 2020-03-20 LAB — POCT URINALYSIS DIP (MANUAL ENTRY)
Bilirubin, UA: NEGATIVE
Glucose, UA: NEGATIVE mg/dL
Ketones, POC UA: NEGATIVE mg/dL
Leukocytes, UA: NEGATIVE
Nitrite, UA: NEGATIVE
Protein Ur, POC: NEGATIVE mg/dL
Spec Grav, UA: 1.015 (ref 1.010–1.025)
Urobilinogen, UA: 0.2 E.U./dL
pH, UA: 7 (ref 5.0–8.0)

## 2020-03-20 LAB — POCT UA - MICROSCOPIC ONLY

## 2020-03-20 MED ORDER — BETAMETHASONE DIPROPIONATE AUG 0.05 % EX OINT: TOPICAL_OINTMENT | Freq: Two times a day (BID) | CUTANEOUS | 0 refills | Status: DC

## 2020-03-20 NOTE — Patient Instructions (Addendum)
We have sent a high potency steroid ointment to the pharmacy.  Please use this on the affected areas twice a day.  Please be very careful not to spread any ointment to other parts of the body, especially face or genital area. This ointment can also cause changes in skin color which can be permanent. Please use this for up to 14 days.  If the rash is getting worse, please discontinue the ointment and call the office.   Betamethasone skin cream, gel, lotion, or ointment What is this medicine? BETAMETHASONE (bay ta METH a sone) is a corticosteroid. It is used on the skin to treat itching, redness, and swelling caused by some skin conditions. This medicine may be used for other purposes; ask your health care provider or pharmacist if you have questions. COMMON BRAND NAME(S): Alphatrex, Beta Derm, Beta-Val, Betanate, Betatrex, Del-Beta, Diprolene, Diprolene AF, Diprosone, Maxivate, RRB Pak, Valisone What should I tell my health care provider before I take this medicine? They need to know if you have any of these conditions:  acne or rosacea  any type of active infection  diabetes  eye disease, vision problems  glaucoma or cataracts  large areas of burned or damaged skin  skin wasting or thinning  an unusual or allergic reaction to betamethasone, corticosteroids, other medicines, foods, dyes, or preservatives  pregnant or trying to get pregnant  breast-feeding How should I use this medicine? This medicine is for external use only. Do not take by mouth. Follow the directions on the prescription label. Wash your hands before and after use. Apply a thin film of medicine to the affected area. Do not cover with a bandage or dressing unless your doctor or health care professional tells you to. Do not use on healthy skin or over large areas of skin. Do not get this medicine in your eyes. If you do, rinse out with plenty of cool tap water. It is important not to use more medicine than prescribed.  Do not use your medicine more often than directed. Do not use for more than 14 days. Talk to your pediatrician regarding the use of this medicine in children. Special care may be needed. While this drug may be prescribed for children as young as 18 years of age for selected conditions, precautions do apply. If applying this medicine to the diaper area of a child, do not cover with tight-fitting diapers or plastic pants. This may increase the amount of medicine that passes through the skin and increase the risk of serious side effects. Elderly patients are more likely to have damaged skin through aging, and this may increase side effects. This medicine should only be used for brief periods and infrequently in older patients. Overdosage: If you think you have taken too much of this medicine contact a poison control center or emergency room at once. NOTE: This medicine is only for you. Do not share this medicine with others. What if I miss a dose? If you miss a dose, use it as soon as you can. If it is almost time for your next dose, use only that dose. Do not use double or extra doses. What may interact with this medicine? Interactions are not expected. Do not use any other skin products without telling your doctor or health care professional. This list may not describe all possible interactions. Give your health care provider a list of all the medicines, herbs, non-prescription drugs, or dietary supplements you use. Also tell them if you smoke, drink alcohol, or  use illegal drugs. Some items may interact with your medicine. What should I watch for while using this medicine? Tell your doctor or health care professional if your symptoms do not improve within one week. Tell your doctor or health care professional if you are exposed to anyone with measles or chickenpox, or if you develop sores or blisters that do not heal properly. What side effects may I notice from receiving this medicine? Side effects  that you should report to your doctor or health care professional as soon as possible:  allergic reactions like skin rash, itching or hives, swelling of the face, lips, or tongue  burning or itching of the skin that does not go away  dark red spots on the skin  infection  lack of healing of skin condition  painful, red, pus filled blisters in hair follicles  thinning of the skin, with easy bruising Side effects that usually do not require medical attention (report to your doctor or health care professional if they continue or are bothersome):  dry skin  increased redness or scaling of the skin  mild burning, itching, or irritation of the skin This list may not describe all possible side effects. Call your doctor for medical advice about side effects. You may report side effects to FDA at 1-800-FDA-1088. Where should I keep my medicine? Keep out of the reach of children. Store at room temperature between 2 and 30 degrees C (36 and 86 degrees F). Do not freeze. Throw away any unused medicine after the expiration date. NOTE: This sheet is a summary. It may not cover all possible information. If you have questions about this medicine, talk to your doctor, pharmacist, or health care provider.  2020 Elsevier/Gold Standard (2017-09-16 15:28:00)  Dyshidrotic Eczema Dyshidrotic eczema (pompholyx) is a type of eczema that causes very itchy (pruritic), fluid-filled blisters (vesicles) to form on the hands and feet. It can affect people of any age, but is more common before the age of 50. There is no cure, but treatment and certain lifestyle changes can help relieve symptoms. What are the causes? The cause of this condition is not known. What increases the risk? You are more likely to develop this condition if:  You wash your hands frequently.  You have a personal history or family history of eczema, allergies, asthma, or hay fever.  You are allergic to metals such as nickel or  cobalt.  You work with cement.  You smoke. What are the signs or symptoms? Symptoms of this condition may affect the hands, feet, or both. Symptoms may come and go (recur), and may include:  Severe itching, which may happen before blisters appear.  Blisters. These may form suddenly. ? In the early stages, blisters may form near the fingertips. ? In severe cases, blisters may grow to large blister masses (bullae). ? Blisters resolve in 2-3 weeks without bursting. This is followed by a dry phase in which itching eases.  Pain and swelling.  Cracks or long, narrow openings (fissures) in the skin.  Severe dryness.  Ridges on the nails. How is this diagnosed? This condition may be diagnosed based on:  A physical exam.  Your symptoms.  Your medical history.  Skin scrapings to rule out a fungal infection.  Testing a swab of fluid for bacteria (culture).  Removing and checking a small piece of skin (biopsy) in order to test for infection or to rule out other conditions.  Skin patch tests. These tests involve taking patches that contain possible  allergens and placing them on your back. Your health care provider will wait a few days and then check to see if an allergic reaction occurred. These tests may be done if your health care provider suspects allergic reactions, or to rule out other types of eczema. You may be referred to a health care provider who specializes in the skin (dermatologist) to help diagnose and treat this condition. How is this treated? There is no cure for this condition, but treatment can help relieve symptoms. Depending on how many blisters you have and how severe they are, your health care provider may suggest:  Avoiding allergens, irritants, or triggers that worsen symptoms. This may involve lifestyle changes such as: ? Using different lotions or soaps. ? Avoiding hot weather or places that will cause you to sweat a lot. ? Managing stress with coping  techniques such as relaxation and exercise, and asking for help when you need it. ? Diet changes as recommended by your health care provider.  Using a clean, damp towel (cool compress) to relieve symptoms.  Soaking in a bath that contains a type of salt that relieves irritation (aluminum acetate soaks).  Medicine taken by mouth to reduce itching (oral antihistamines).  Medicine applied to the skin to reduce swelling and irritation (topical corticosteroids).  Medicine that reduces the activity of the body's disease-fighting system (immunosuppressants) to treat inflammation. This may be given in severe cases.  Antibiotic medicines to treat bacterial infection.  Light therapy (phototherapy). This involves shining ultraviolet (UV) light on affected skin in order to reduce itchiness and inflammation. Follow these instructions at home: Bathing and skin care   Wash skin gently. After bathing or washing your hands, pat your skin dry. Avoid rubbing your skin.  Remove all jewelry before bathing. If the skin under the jewelry stays wet, blisters may form or get worse.  Apply cool compresses as told by your health care provider: ? Soak a clean towel in cool water. ? Wring out excess water until towel is damp. ? Place the towel over affected skin. Leave the towel on for 20 minutes at a time, 2-3 times a day.  Use mild soaps, cleansers, and lotions that do not contain dyes, perfumes, or other irritants.  Keep your skin hydrated. To do this: ? Avoid very hot water. Take lukewarm baths or showers. ? Apply moisturizer within three minutes of bathing. This locks in moisture. Medicines  Take and apply over-the-counter and prescription medicines only as told by your health care provider.  If you were prescribed antibiotic medicine, take or apply it as told by your health care provider. Do not stop using the antibiotic even if you start to feel better. General instructions  Identify and avoid  triggers and allergens.  Keep fingernails short to avoid breaking open the skin while scratching.  Use waterproof gloves to protect your hands when doing work that keeps your hands wet for a long time.  Wear socks to keep your feet dry.  Do not use any products that contain nicotine or tobacco, such as cigarettes and e-cigarettes. If you need help quitting, ask your health care provider.  Keep all follow-up visits as told by your health care provider. This is important. Contact a health care provider if:  You have symptoms that do not go away.  You have signs of infection, such as: ? Crusting, pus, or a bad smell. ? More redness, swelling, or pain. ? Increased warmth in the affected area. Summary  Dyshidrotic eczema (pompholyx)  is a type of eczema that causes very itchy (pruritic), fluid-filled blisters (vesicles) to form on the hands and feet.  The cause of this condition is not known.  There is no cure for this condition, but treatment can help relieve symptoms. Treatment depends on how many blisters you have and how severe they are.  Use mild soaps, cleansers, and lotions that do not contain dyes, perfumes, or other irritants. Keep your skin hydrated. This information is not intended to replace advice given to you by your health care provider. Make sure you discuss any questions you have with your health care provider. Document Revised: 08/18/2018 Document Reviewed: 09/11/2016 Elsevier Patient Education  [place].

## 2020-03-20 NOTE — Assessment & Plan Note (Signed)
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  Per chart review, appears that patient has previously been seen with urology.  I have recommended that the patient schedule an appointment with urology for possible cystoscopy and further work-up given no resolution of trace hemoglobinuria.  Patient's daughter answered the phone and expressed understanding.  They do not need a referral at this time as they are already established with [facility].

## 2020-03-20 NOTE — Progress Notes (Signed)
dip 

## 2020-03-20 NOTE — Assessment & Plan Note (Signed)
Patient's exam and history most consistent with dyshidrotic eczema.  Will treat with high potency steroids.  Provided patient with information about adverse effects of steroids and instructions for use.  Will try topical potency steroids twice daily x2 weeks.  If patient has any adverse effects, she should discontinue the medication immediately.  She should avoid touching her face, or other parts of her body when she has the steroid ointment applied.  Patient does have follow-up with dermatology next month.

## 2020-03-20 NOTE — Progress Notes (Addendum)
° ° °  SUBJECTIVE:  CHIEF COMPLAINT / HPI:  Patient accompanied by her daughter, Sharyn Lull, today who serves as interpreter at patient's request.  Peeling Hands  Started on right hand about three months ago and slowly went to both hands and has continued to worsen. Went to UC and Dr. Ardelia Mems for this and has not had any improvement with ketoconazole, vaseline. She works at a [employer] for work. No new products started at work. She gets cracks in her skin and sometimes it bleeds as well. Patient has follow up with dermatology in a month, but she is experiencing pain right now.  Has been using ketoconazole BID x three months but is getting worse.  Triamcinilone in August x 1 month and stayed stable.  No other new medications  No hx of rashes or eczema.   Bladder Fullness  Patient complains of bladder fullness after urination.  She denies any pain.  She denies any vaginal bleeding, dysuria.  Patient has a history of hemoglobinuria and last seen with Dr. Ouida Sills in February 2021.  At that time, she noted having a urology appointment in 2017 with inconclusive work-up.  Also history of oophorectomy (left) and cesarean section.  On bedside ultrasound, patient patient's postvoid residual was 25 cc.  PERTINENT  PMH / PSH: constipation, hemoglobinuria   OBJECTIVE:  BP 106/60    Pulse (!) 57    Ht 4\' 10"  (1.473 m)    Wt 100 lb 9.6 oz (45.6 kg)    LMP 06/28/2012 (Approximate)    SpO2 98%    BMI 21.03 kg/m   General: Well-appearing female, no acute distress Skin: Multiple patches of dry, cracked skin on palmar and dorsal aspect of bilateral hands.  Please see pictures below.  No active draining, erythema, warmth appreciated on exam today. Abdomen: No tenderness to palpation of lower abdomen or pelvic area with deep palpation with transducer.           ASSESSMENT/PLAN:  Dyshidrotic eczema Patient's exam and history most consistent with dyshidrotic eczema.  Will treat with high potency steroids.   Provided patient with information about adverse effects of steroids and instructions for use.  Will try topical potency steroids twice daily x2 weeks.  If patient has any adverse effects, she should discontinue the medication immediately.  She should avoid touching her face, or other parts of her body when she has the steroid ointment applied.  Patient does have follow-up with dermatology next month.  Hemoglobinuria Patient with continued hemoglobinuria, trace, on UA today.  Per chart review, appears that patient has previously been seen with urology.  I have recommended that the patient schedule an appointment with urology for possible cystoscopy and further work-up given no resolution of trace hemoglobinuria.  Patient's daughter answered the phone and expressed understanding.  They do not need a referral at this time as they are already established with [facility].    Wilber Oliphant, MD [place]

## 2020-03-22 ENCOUNTER — Other Ambulatory Visit: Payer: Self-pay | Admitting: Family Medicine

## 2020-03-22 DIAGNOSIS — R823 Hemoglobinuria: Secondary | ICD-10-CM

## 2020-03-22 NOTE — Progress Notes (Signed)
Received message requesting referral to urology. Already has appointment scheduled with urology, just needs referral for insurance purposes.  Notably referral is for hemoglobinuria due to trace-intact blood detected on UA. Recent urine microscopy with 0-2 RBC, which does not qualify as microscopic hematuria and often does not need additional workup. However, since patient already has appointment and was already advised to be referred, will place referral.  Leeanne Rio, MD

## 2020-04-09 ENCOUNTER — Ambulatory Visit: Payer: 59 | Admitting: Physician Assistant

## 2020-04-16 ENCOUNTER — Telehealth: Payer: Self-pay | Admitting: Nurse Practitioner

## 2020-04-16 ENCOUNTER — Ambulatory Visit (INDEPENDENT_AMBULATORY_CARE_PROVIDER_SITE_OTHER): Payer: 59 | Admitting: Nurse Practitioner

## 2020-04-16 ENCOUNTER — Encounter: Payer: Self-pay | Admitting: Nurse Practitioner

## 2020-04-16 VITALS — BP 118/62 | HR 56 | Ht <= 58 in | Wt 102.0 lb

## 2020-04-16 DIAGNOSIS — K5900 Constipation, unspecified: Secondary | ICD-10-CM

## 2020-04-16 DIAGNOSIS — K649 Unspecified hemorrhoids: Secondary | ICD-10-CM

## 2020-04-16 DIAGNOSIS — K219 Gastro-esophageal reflux disease without esophagitis: Secondary | ICD-10-CM | POA: Diagnosis not present

## 2020-04-16 DIAGNOSIS — L290 Pruritus ani: Secondary | ICD-10-CM | POA: Diagnosis not present

## 2020-04-16 MED ORDER — HYDROCORTISONE ACETATE 25 MG RE SUPP: 25.0000 mg | Freq: Every day | RECTAL | 0 refills | Status: DC

## 2020-04-16 MED ORDER — OMEPRAZOLE 20 MG PO CPDR
20.0000 mg | DELAYED_RELEASE_CAPSULE | Freq: Every day | ORAL | 1 refills | Status: DC
Start: 2020-04-16 — End: 2020-06-04

## 2020-04-16 NOTE — Telephone Encounter (Signed)
Pt's daughter Sharyn Lull states the pt's medication ANUSOL is too expensive so she is requesting a cheaper alternative.  Pharmacy: [facility]

## 2020-04-16 NOTE — Progress Notes (Signed)
04/16/2020 [name] [id number] March 12, 1966   Chief Complaint: Anal itchiness  History of Present Illness: Amanda H.  Morgan is a 54 year old female   She was last seen in the office by Dr. Ardis Hughs on 02/21/2020 due to having rectal itchiness.  At that time, her anal exam was documented as completely normal.  She was advised to use baby wipes for 2 weeks. She presents today with a [place] interpretor. She complains of having anal itchiness which did not improve after using diaper wipes x 2 weeks. Her anal itchiness comes and goes for a few days then might last for one week then goes away for a few days the recurs.  When she has active anal itchiness it lasts for 30 seconds to 1 minute and it occurs randomly.  Her anal itchiness is not worse at nighttime.  She lives with her adult children, no other family members have similar anal itchiness.  She denies using any soap to the anal area when bathing.  She previously used Triamcinolone cream without improvement.  No anal or rectal pain. No lower abdominal pain.  She has intermittent constipation.  She describes passing small pellet-like stools most days.  Yesterday, she did not have a bowel movement and today she passed a pellet-like stool followed by a formed solid brown stool.  No rectal bleeding.  No black stools.  She underwent a colonoscopy 07/24/2014 which was normal.  A repeat colonoscopy in 10 years was recommended.  No family history of colon cancer.  Two of her brothers have a history of colon polyps.  He also complains of increased heartburn.  She takes Omeprazole 20 mg a few days weekly.  She elected not to take Omeprazole daily due to her concerns for developing osteoporosis.  No dysphagia.  No stomach pain.  She underwent an EGD 07/29/2016 which was normal.  No weight loss.  No other complaints at this time.  EGD 07/29/2016: - The esophagus was normal. - The stomach was normal. - The examined duodenum was normal.  Colonoscopy   07/24/2014 Normal colonoscopy. No polyps or cancers   CBC Latest Ref Rng & Units 12/13/2019 07/04/2019 02/27/2019  WBC 3.4 - 10.8 x10E3/uL - 4.8 3.5(L)  Hemoglobin 11 - 14.6 g/dL 12.7 13.1 13.2  Hematocrit 34.0 - 46.6 % - 38.0 38.7  Platelets 150 - 450 x10E3/uL - 218 143(L)    CMP Latest Ref Rng & Units 07/04/2019 05/17/2019 02/27/2019  Glucose 65 - 99 mg/dL 78 87 116(H)  BUN 6 - 24 mg/dL 11 12 9   Creatinine 0.57 - 1.00 mg/dL 0.66 0.65 0.58  Sodium 134 - 144 mmol/L 139 138 131(L)  Potassium 3.5 - 5.2 mmol/L 4.5 4.2 4.3  Chloride 96 - 106 mmol/L 100 99 96(L)  CO2 20 - 29 mmol/L 26 26 26   Calcium 8.7 - 10.2 mg/dL 9.8 9.7 9.3  Total Protein 6.0 - 8.5 g/dL 6.9 7.5 7.5  Total Bilirubin 0.0 - 1.2 mg/dL 0.5 0.4 0.6  Alkaline Phos 39 - 117 IU/L 81 90 69  AST 0 - 40 IU/L 29 28 37  ALT 0 - 32 IU/L 25 26 32   Current Outpatient Medications on File Prior to Visit  Medication Sig Dispense Refill  . triamcinolone cream (KENALOG) 0.1 % Apply 1 application topically 2 (two) times daily. To hand 30 g 0   No current facility-administered medications on file prior to visit.   Allergies  Allergen Reactions  . Beef-Derived  Products Hives   Current Outpatient Medications on File Prior to Visit  Medication Sig Dispense Refill  . triamcinolone cream (KENALOG) 0.1 % Apply 1 application topically 2 (two) times daily. To hand 30 g 0   No current facility-administered medications on file prior to visit.   Allergies  Allergen Reactions  . Beef-Derived Products Hives     Current Medications, Allergies, Past Medical History, Past Surgical History, Family History and Social History were reviewed in [facility] [employer] record.   Review of Systems:   Constitutional: Negative for fever, sweats, chills or weight loss.  Respiratory: Negative for shortness of breath.   Cardiovascular: Negative for chest pain, palpitations and leg swelling.  Gastrointestinal: See HPI.  Musculoskeletal:  Negative for back pain or muscle aches.  Neurological: Negative for dizziness, headaches or paresthesias.    Physical Exam: BP 118/62   Pulse (!) 56   Ht 4\' 10"  (1.473 m)   Wt 102 lb (46.3 kg)   LMP 06/28/2012 (Approximate)   BMI 21.32 kg/m   General: Petite 54 year old [place] female in no acute distress. Head: Normocephalic and atraumatic. Eyes: No scleral icterus. Conjunctiva pink . Ears: Normal auditory acuity. Mouth: No ulcers or lesions.  Lungs: Clear throughout to auscultation. Heart: Regular rate and rhythm, no murmur. Abdomen: Soft, nondistended. Very slight tenderness RLQ without rebound or guarding. No masses or hepatomegaly. Normal bowel sounds x 4 quadrants.  Rectal: Anal folds thickened to the anterior and posterior areas without an obvious anal fissure. Anal hemorrhoids friable without active bleeding or prolapse. No stool. No mass. No blood in the rectal vault. Note the [place] interpretor was present with back towards patient during rectal exam.  Musculoskeletal: Symmetrical with no gross deformities. Extremities: No edema. Neurological: Alert oriented x 4. No focal deficits.  Psychological: Alert and cooperative. Normal mood and affect  Assessment and Recommendations: 30.  55 year old female with ani pruritis, most likely due to anal hemorrhoids -Hydrocortisone 25 mg suppository 1 PR nightly x5 nights.  Apply a Desitin a small amount inside the anal area into the external area at bedtime after inserting the suppository.  -Also use Desitin 2-3 times daily for the next 10 days then as needed.  2.  Colon cancer screening.  Colonoscopy 07/2014 was normal.  Family history of colon polyps (two brothers). -Consider a repeat colonoscopy prior to her 10-year recall date, to discuss further at the time of her follow-up appointment in 2 months  3.  Heartburn. History of functional dyspepsia. EGD 07/2016 was normal. -Omeprazole 20mg  QD for now -Consider EGD at time of next  colonoscopy if symptoms persist or worsen  4. Constipation  -Miralax Q HS  Patient to follow-up in the office in 2 months.

## 2020-04-16 NOTE — Patient Instructions (Addendum)
  Insert one Hydrocortisone suppository into the rectum at bed time for 5 nights.   Apply a small amount of Desitin inside the anal opening and to the external anal area tid x 10 days then as needed.  Miralax one capful mixed in 8 ounces of water at bed time as needed for constipation.   Start Omeprazole 20 mg once daily.   If you are age 56 or older, your body mass index should be between 23-30. Your Body mass index is 21.32 kg/m. If this is out of the aforementioned range listed, please consider follow up with your Primary Care Provider.  If you are age 68 or younger, your body mass index should be between 19-25. Your Body mass index is 21.32 kg/m. If this is out of the aformentioned range listed, please consider follow up with your Primary Care Provider.

## 2020-04-17 MED ORDER — HYDROCORTISONE (PERIANAL) 2.5 % EX CREA: 1.0000 "application " | TOPICAL_CREAM | Freq: Every day | CUTANEOUS | 0 refills | Status: DC

## 2020-04-17 NOTE — Telephone Encounter (Signed)
Left message for patient's daughter to call the office. Sent in Hydrocortisone cream. Patient will need to pick up preparation H suppositories and apply a small amount of the Hydrocortisone cream to the suppository.

## 2020-04-17 NOTE — Progress Notes (Signed)
I agree with the above note, plan 

## 2020-05-29 ENCOUNTER — Encounter: Payer: 59 | Admitting: Family Medicine

## 2020-05-30 ENCOUNTER — Encounter: Payer: 59 | Admitting: Family Medicine

## 2020-06-03 NOTE — Progress Notes (Signed)
06/03/2020 [name] [id number] 1965-10-31   Chief Complaint: Acid reflux, anal irritation and rectal bleeding  History of Present Illness: [name] is a 55 year old female with a past medical history of hyperlipidemia and GERD. She presents with a [place] interpretor as she speaks [place].  I last saw the patient in office on 04/16/2020 for further evaluation regarding anal itchiness and reflux symptoms.  At that time, she was assessed to have a small anal hemorrhoids without prolapse or active bleeding.  She was prescribed Anusol suppositories to use for 5 nights and to apply Desitin to the anal area and her symptoms improved but have not completely abated.  She was also having increased heartburn and she was advised to take Omeprazole 20 mg daily.  She reports having 3-4 episodes of bright red blood on the toilet tissue over the past year, last occurred more than 3 months ago. She wishes to schedule a colonoscopy.  She underwent a colonoscopy 07/24/2014 by Dr. Ardis Hughs which was normal.  This was her first and only colonoscopy.  A repeat colonoscopy in 10 years was recommended.  No family history of colon cancer.  However, 2 of her brothers have a history of colon polyps.  She is also concerned she may have intestinal worms.  She reported to have intestinal worms as a child.  Her reflux symptoms are fairly well controlled but there are certain foods she can no longer eats such as yogurt which trigger heartburn.  She questions why she has intermittent heartburn and request to schedule an EGD.  No dysphagia.  No upper or lower abdominal pain.  No family history of esophageal or gastric cancer. No weight loss.  Her constipation is fairly well controlled.  She is passing normal formed brown bowel movement daily.  No melena.  No other complaints at this time.   CBC Latest Ref Rng & Units 12/13/2019 07/04/2019 02/27/2019  WBC 3.4 - 10.8 x10E3/uL - 4.8 3.5(L)  Hemoglobin 11 - 14.6 g/dL 12.7  13.1 13.2  Hematocrit 34.0 - 46.6 % - 38.0 38.7  Platelets 150 - 450 x10E3/uL - 218 143(L)    CMP Latest Ref Rng & Units 07/04/2019 05/17/2019 02/27/2019  Glucose 65 - 99 mg/dL 78 87 116(H)  BUN 6 - 24 mg/dL 11 12 9   Creatinine 0.57 - 1.00 mg/dL 0.66 0.65 0.58  Sodium 134 - 144 mmol/L 139 138 131(L)  Potassium 3.5 - 5.2 mmol/L 4.5 4.2 4.3  Chloride 96 - 106 mmol/L 100 99 96(L)  CO2 20 - 29 mmol/L 26 26 26   Calcium 8.7 - 10.2 mg/dL 9.8 9.7 9.3  Total Protein 6.0 - 8.5 g/dL 6.9 7.5 7.5  Total Bilirubin 0.0 - 1.2 mg/dL 0.5 0.4 0.6  Alkaline Phos 39 - 117 IU/L 81 90 69  AST 0 - 40 IU/L 29 28 37  ALT 0 - 32 IU/L 25 26 32   EGD 07/29/2016: - The esophagus was normal. - The stomach was normal. - The examined duodenum was normal.  Colonoscopy  07/24/2014 Normal colonoscopy. No polyps or cancers  Past Medical History:  Diagnosis Date  . Allergy   . Chest pain 03/06/2016      . Dizziness 03/08/2015  . GERD (gastroesophageal reflux disease)   . Hyperlipidemia    BORDERLINE  . Left flank pain 11/29/2015   Current Outpatient Medications on File Prior to Visit  Medication Sig Dispense Refill  . hydrocortisone (ANUSOL-HC) 2.5 % rectal cream  Place 1 application rectally at bedtime. 15 g 0  . hydrocortisone (ANUSOL-HC) 25 MG suppository Place 1 suppository (25 mg total) rectally at bedtime. 5 suppository 0   No current facility-administered medications on file prior to visit.   Allergies  Allergen Reactions  . Beef-Derived Products Hives     Current Medications, Allergies, Past Medical History, Past Surgical History, Family History and Social History were reviewed in [employer] record.   Review of Systems:   Constitutional: Negative for fever, sweats, chills or weight loss.  Respiratory: Negative for shortness of breath.   Cardiovascular: Negative for chest pain, palpitations and leg swelling.  Gastrointestinal: See HPI.  Musculoskeletal: Negative for  back pain or muscle aches.  Neurological: Negative for dizziness, headaches or paresthesias.    Physical Exam: LMP 06/28/2012 (Approximate)   BP 108/60   Pulse 63   Ht 4\' 10"  (1.473 m)   Wt 103 lb (46.7 kg)   LMP 06/28/2012 (Approximate)   BMI 21.53 kg/m   Wt Readings from Last 3 Encounters:  06/04/20 103 lb (46.7 kg)  04/16/20 102 lb (46.3 kg)  03/20/20 100 lb 9.6 oz (45.6 kg)   General: Petite 55 year old female in no acute distress. Head: Normocephalic and atraumatic. Eyes: No scleral icterus. Conjunctiva pink . Ears: Normal auditory acuity. Mouth: Dentition intact. No ulcers or lesions.  Lungs: Clear throughout to auscultation. Heart: Regular rate and rhythm, no murmur. Abdomen: Soft, nontender and nondistended. No masses or hepatomegaly. Normal bowel sounds x 4 quadrants.  Rectal: Deferred.  Musculoskeletal: Symmetrical with no gross deformities. Extremities: No edema. Neurological: Alert oriented x 4. No focal deficits.  Psychological: Alert and cooperative. Normal mood and affect  Assessment and Recommendations:  81.  55 year old [place] female with infrequent bright red rectal bleeding.  Intermittent anal pruritus.  Normal colonoscopy 07/2014.  Family history of colon polyps ( 2 brothers). -Her one and only colonoscopy was normal 07/2014, however, she is considered at a higher risk for colorectal cancer due to her age, Asian heritage, rectal bleeding and positive family history of colon polyps therefore I have recommended a colonoscopy. Colonoscopy benefits and risks discussed including risk with sedation, risk of bleeding, perforation and infection  -Further follow-up to be determined after colonoscopy completed  2.  Intermittent heartburn.  History of functional dyspepsia.  Normal EGD 12/2016. -Omeprazole 40 mg once daily -Repeat EGD not warranted at this time, further recommendations per Dr. Ardis Hughs

## 2020-06-04 ENCOUNTER — Encounter: Payer: Self-pay | Admitting: Nurse Practitioner

## 2020-06-04 ENCOUNTER — Other Ambulatory Visit: Payer: Self-pay

## 2020-06-04 ENCOUNTER — Ambulatory Visit (INDEPENDENT_AMBULATORY_CARE_PROVIDER_SITE_OTHER): Payer: 59 | Admitting: Nurse Practitioner

## 2020-06-04 VITALS — BP 108/60 | HR 63 | Ht <= 58 in | Wt 103.0 lb

## 2020-06-04 DIAGNOSIS — K5900 Constipation, unspecified: Secondary | ICD-10-CM

## 2020-06-04 DIAGNOSIS — Z8371 Family history of colonic polyps: Secondary | ICD-10-CM | POA: Diagnosis not present

## 2020-06-04 DIAGNOSIS — K625 Hemorrhage of anus and rectum: Secondary | ICD-10-CM | POA: Diagnosis not present

## 2020-06-04 MED ORDER — OMEPRAZOLE 40 MG PO CPDR: 40.0000 mg | DELAYED_RELEASE_CAPSULE | Freq: Every day | ORAL | 1 refills | Status: DC

## 2020-06-04 MED ORDER — SUPREP BOWEL PREP KIT 17.5-3.13-1.6 GM/177ML PO SOLN: 1.0000 | ORAL | 0 refills | Status: DC

## 2020-06-04 MED ORDER — SUPREP BOWEL PREP KIT 17.5-3.13-1.6 GM/177ML PO SOLN
1.0000 | ORAL | 0 refills | Status: DC
Start: 2020-06-04 — End: 2020-07-17

## 2020-06-04 NOTE — Patient Instructions (Addendum)
If you are age 57 or older, your body mass index should be between 23-30. Your Body mass index is 21.53 kg/m. If this is out of the aforementioned range listed, please consider follow up with your Primary Care Provider.  If you are age 52 or younger, your body mass index should be between 19-25. Your Body mass index is 21.53 kg/m. If this is out of the aformentioned range listed, please consider follow up with your Primary Care Provider.   You have been scheduled for a colonoscopy. Please follow written instructions given to you at your visit today.  Please pick up your prep supplies at the pharmacy within the next 1-3 days. If you use inhalers (even only as needed), please bring them with you on the day of your procedure.     MEDICATION  We have sent the following medication to your pharmacy for you to pick up at your convenience:  Omeprazole 40 MG tablet once a day.  OVER THE COUNTER MEDICATION  Please purchase the following medications over the counter and take as directed:  Miralax. Dissolve one capful in 8 ounces of water and drink before bed.  Benefiber , one tablespoon a day.  It was great seeing you today!  Thank you for entrusting me with your care and choosing [facility].  Noralyn Pick, CRNP

## 2020-06-05 ENCOUNTER — Other Ambulatory Visit: Payer: Self-pay

## 2020-06-05 ENCOUNTER — Encounter: Payer: Self-pay | Admitting: Family Medicine

## 2020-06-05 ENCOUNTER — Ambulatory Visit (INDEPENDENT_AMBULATORY_CARE_PROVIDER_SITE_OTHER): Payer: 59 | Admitting: Family Medicine

## 2020-06-05 VITALS — BP 98/62 | HR 54 | Ht <= 58 in | Wt 102.0 lb

## 2020-06-05 DIAGNOSIS — E785 Hyperlipidemia, unspecified: Secondary | ICD-10-CM

## 2020-06-05 DIAGNOSIS — R1011 Right upper quadrant pain: Secondary | ICD-10-CM

## 2020-06-05 DIAGNOSIS — Z23 Encounter for immunization: Secondary | ICD-10-CM | POA: Diagnosis not present

## 2020-06-05 DIAGNOSIS — R399 Unspecified symptoms and signs involving the genitourinary system: Secondary | ICD-10-CM | POA: Diagnosis not present

## 2020-06-05 LAB — POCT URINALYSIS DIP (MANUAL ENTRY)
Bilirubin, UA: NEGATIVE
Blood, UA: NEGATIVE
Glucose, UA: NEGATIVE mg/dL
Ketones, POC UA: NEGATIVE mg/dL
Leukocytes, UA: NEGATIVE
Nitrite, UA: NEGATIVE
Protein Ur, POC: NEGATIVE mg/dL
Spec Grav, UA: 1.015 (ref 1.010–1.025)
Urobilinogen, UA: 0.2 E.U./dL
pH, UA: 7.5 (ref 5.0–8.0)

## 2020-06-05 NOTE — Patient Instructions (Addendum)
It was great seeing you today! I am glad to see you are doing well! Today we are checking your blood work. I will call you if anything is abnormal.  We will see you back in 1 year for your next annual exam, but if you need to be seen earlier than that for any new issues we're happy to fit you in, just give [place] a call!  Visit Reminders: - Continue to work on your healthy eating habits and incorporating exercise into your daily life.   Regarding lab work today:  Due to recent changes in healthcare laws, you may see the results of your imaging and laboratory studies on MyChart before your provider has had a chance to review them.  I understand that in some cases there may be results that are confusing or concerning to you. Not all laboratory results come back in the same time frame and you may be waiting for multiple results in order to interpret others.  Please give [place] 72 hours in order for your provider to thoroughly review all the results before contacting the office for clarification of your results. If everything is normal, you will get a letter in the mail or a message in My Chart. Please give [place] a call if you do not hear from [place] after 2 weeks.  Please bring all of your medications with you to each visit.    If you haven't already, sign up for My Chart to have easy access to your labs results, and communication with your primary care physician.  Feel free to call with any questions or concerns at any time, at [phone].   Take care,  Dr. Shary Key [facility]

## 2020-06-05 NOTE — Progress Notes (Signed)
I agree with the above note, plan 

## 2020-06-06 LAB — CBC
Hematocrit: 39.7 % (ref 34.0–46.6)
Hemoglobin: 13.7 g/dL (ref 11.1–15.9)
MCH: 31.9 pg (ref 26.6–33.0)
MCHC: 34.5 g/dL (ref 31.5–35.7)
MCV: 92 fL (ref 79–97)
Platelets: 236 10*3/uL (ref 150–450)
RBC: 4.3 x10E6/uL (ref 3.77–5.28)
RDW: 11.6 % — ABNORMAL LOW (ref 11.7–15.4)
WBC: 5 10*3/uL (ref 3.4–10.8)

## 2020-06-06 LAB — LIPID PANEL
Chol/HDL Ratio: 3.1 ratio (ref 0.0–4.4)
Cholesterol, Total: 255 mg/dL — ABNORMAL HIGH (ref 100–199)
HDL: 82 mg/dL (ref >39)
LDL Chol Calc (NIH): 155 mg/dL — ABNORMAL HIGH (ref 0–99)
Triglycerides: 103 mg/dL (ref 0–149)
VLDL Cholesterol Cal: 18 mg/dL (ref 5–40)

## 2020-06-06 LAB — COMPREHENSIVE METABOLIC PANEL
ALT: 22 IU/L (ref 0–32)
AST: 22 IU/L (ref 0–40)
Albumin/Globulin Ratio: 2.1 (ref 1.2–2.2)
Albumin: 4.8 g/dL (ref 3.8–4.9)
Alkaline Phosphatase: 84 IU/L (ref 44–121)
BUN/Creatinine Ratio: 16 (ref 9–23)
BUN: 11 mg/dL (ref 6–24)
Bilirubin Total: 0.5 mg/dL (ref 0.0–1.2)
CO2: 26 mmol/L (ref 20–29)
Calcium: 10 mg/dL (ref 8.7–10.2)
Chloride: 101 mmol/L (ref 96–106)
Creatinine, Ser: 0.7 mg/dL (ref 0.57–1.00)
GFR calc Af Amer: 112 mL/min/{1.73_m2} (ref >59)
GFR calc non Af Amer: 97 mL/min/{1.73_m2} (ref >59)
Globulin, Total: 2.3 g/dL (ref 1.5–4.5)
Glucose: 86 mg/dL (ref 65–99)
Potassium: 4.1 mmol/L (ref 3.5–5.2)
Sodium: 141 mmol/L (ref 134–144)
Total Protein: 7.1 g/dL (ref 6.0–8.5)

## 2020-06-06 NOTE — Progress Notes (Signed)
    SUBJECTIVE:   CHIEF COMPLAINT / HPI:   Ms. Bray is a 55 yo who presents for her annual exam A translator was used via Ipad for this visit   Patient complains of chills for the past 3 months. States she is worried about an infection. Denies any fevers, dysuria, recent illness. She requests to be checked for urinary infection.  She also endorses right sided upper and lower abdominal pain. She questions if she could potentially have a gallbladder stone since it seems to run in her family.   Patient reports seeing GI for her intermittent bright rectal bleeding and anal pruritis and states they plan on doing a colonoscopy.   Pt states she stopped eating yogurt and other foods that trigger her GERD symptoms. Takes her pantoprazole as needed and reports only intermittent symptoms. Recently saw GI who recommended increasing her medication dose and stated patient did not need an EGD.   Patient is post menopausal and is up to date on pap smear Denies smoking, alcohol use, recreational drug use.   PERTINENT  PMH / PSH:  GERD, hemorrhoids, hematochezia, hematuria, chronic abdominal pain  OBJECTIVE:   BP 98/62   Pulse (!) 54   Ht 4\' 10"  (1.473 m)   Wt 46.3 kg   LMP 06/28/2012 (Approximate)   SpO2 99%   BMI 21.32 kg/m    General exam: well appearing, NAD Lungs: CTAB. Normal WOB Heart: RRR. No murmurs Abdomen: soft, non distended, mildly tender to palpation in RLQ. No guarding. Negative Murphys sign Neuro: Alert and oriented. no focal deficits.  Skin: warm, dry. No edema  ASSESSMENT/PLAN:   No problem-specific Assessment & Plan notes found for this encounter.  Health maintenance Up to date on pap and mammogram. Patient up to date on colonoscopy, but will get a repeat colonoscopy done due to hematochezia and being higher risk for CRC. - COVID booster   Chills Unknown etiology. No associated symptoms such as fever, dysuria, or other concern for infection -CBC -UA   Hemorrhoids   Endorses anal itchiness and occasional constipation.  - Cont hydrocortisone cream and hydrocortisone acetate rectally at bedtime   GERD Stable. Patient taking pantoprazole 20mg  (not 40mg ) as needed which provides relief.  - con't pantoprazole 20mg    Hyperlipidemia Last lipid panel 05/17/19 with cholesterol 262, and LDL 166. Will check lipids and CMET today    F/u in 1 year for next annual exam   [place]

## 2020-07-17 ENCOUNTER — Ambulatory Visit (AMBULATORY_SURGERY_CENTER): Payer: 59 | Admitting: Gastroenterology

## 2020-07-17 ENCOUNTER — Other Ambulatory Visit: Payer: Self-pay

## 2020-07-17 ENCOUNTER — Encounter: Payer: Self-pay | Admitting: Gastroenterology

## 2020-07-17 VITALS — BP 114/68 | HR 60 | Temp 97.7°F | Resp 22 | Ht <= 58 in | Wt 103.0 lb

## 2020-07-17 DIAGNOSIS — K625 Hemorrhage of anus and rectum: Secondary | ICD-10-CM | POA: Diagnosis present

## 2020-07-17 DIAGNOSIS — D128 Benign neoplasm of rectum: Secondary | ICD-10-CM

## 2020-07-17 DIAGNOSIS — D129 Benign neoplasm of anus and anal canal: Secondary | ICD-10-CM

## 2020-07-17 MED ORDER — SODIUM CHLORIDE 0.9 % IV SOLN
500.0000 mL | Freq: Once | INTRAVENOUS | Status: DC
Start: 2020-07-17 — End: 2021-08-01

## 2020-07-17 NOTE — Patient Instructions (Addendum)
YOU HAD AN ENDOSCOPIC PROCEDURE TODAY AT [facility]:   Refer to the procedure report that was given to you for any specific questions about what was found during the examination.  If the procedure report does not answer your questions, please call your gastroenterologist to clarify.  If you requested that your care partner not be given the details of your procedure findings, then the procedure report has been included in a sealed envelope for you to review at your convenience later.  YOU SHOULD EXPECT: Some feelings of bloating in the abdomen. Passage of more gas than usual.  Walking can help get rid of the air that was put into your GI tract during the procedure and reduce the bloating. If you had a lower endoscopy (such as a colonoscopy or flexible sigmoidoscopy) you may notice spotting of blood in your stool or on the toilet paper. If you underwent a bowel prep for your procedure, you may not have a normal bowel movement for a few days.  Please Note:  You might notice some irritation and congestion in your nose or some drainage.  This is from the oxygen used during your procedure.  There is no need for concern and it should clear up in a day or so.  SYMPTOMS TO REPORT IMMEDIATELY:   Following lower endoscopy (colonoscopy or flexible sigmoidoscopy):  Excessive amounts of blood in the stool  Significant tenderness or worsening of abdominal pains  Swelling of the abdomen that is new, acute  Fever of 100F or higher   Black, tarry-looking stools  For urgent or emergent issues, a gastroenterologist can be reached at any hour by calling [phone]. Do not use MyChart messaging for urgent concerns.    DIET:  We do recommend a small meal at first, but then you may proceed to your regular diet.  Drink plenty of fluids but you should avoid alcoholic beverages for 24 hours.  MEDICATIONS:  Continue present medications.  Use a baby wipe as final cleaning after wiping with  toilette paper following bowel movement.  Please see handouts given to you by your recovery nurse.  Thank you for allowing [place] to provide for your healthcare needs today.  ACTIVITY:  You should plan to take it easy for the rest of today and you should NOT DRIVE or use heavy machinery until tomorrow (because of the sedation medicines used during the test).    FOLLOW UP: Our staff will call the number listed on your records 48-72 hours following your procedure to check on you and address any questions or concerns that you may have regarding the information given to you following your procedure. If we do not reach you, we will leave a message.  We will attempt to reach you two times.  During this call, we will ask if you have developed any symptoms of COVID 19. If you develop any symptoms (ie: fever, flu-like symptoms, shortness of breath, cough etc.) before then, please call [phone].  If you test positive for Covid 19 in the 2 weeks post procedure, please call and report this information to [place].    If any biopsies were taken you will be contacted by phone or by letter within the next 1-3 weeks.  Please call us at [phone] if you have not heard about the biopsies in 3 weeks.    SIGNATURES/CONFIDENTIALITY: You and/or your care partner have signed paperwork which will be entered into your electronic medical record.  These signatures attest to the fact  that that the information above on your After Visit Summary has been reviewed and is understood.  Full responsibility of the confidentiality of this discharge information lies with you and/or your care-partner.HM NAY QU V? ? TH?C HI?N TH? THU?T N?I SOI T?I TRUNG TM N?I SOI [place]: Vui lng xem bo co th? thu?t ? ???c g?i cho qu v?, n?u qu v? c b?t k? th?c m?c g trong su?t qu trnh th?m khm. N?u bo co th? thu?t khng th? gi?i ?p th?c m?c c?a qu v?, vui lng g?i cho bc s? chuyn khoa tiu ha c?a qu v? ?? ???c gi?i ?p. N?u qu v?  ? yu c?u khng cung c?p thng tin chi ti?t v? k?t qu? th? thu?t cho ??i tc ch?m [place] c?a qu v?, th bo co th? thu?t s? ???c g?i trong m?t phong b ???c dn kn ?? qu v? xem khi thu?n ti?n.   QU V? C TH?: C?m gic ch??ng b?ng. Trung ti?n nhi?u h?n bnh th??ng. ?i b? c th? gip ??y ra ngoi khng kh ?i vo ???ng tiu ha trong khi th?c hi?n th? thu?t v gi?m ch??ng b?ng. N?u qu v? ti?n hnh n?i soi d??i (nh? n?i soi ??i trng ho?c soi k?t trng xch-ma b?ng ?ng m?m), qu v? c th? th?y cc ch?m mu ? phn ho?c trn gi?y v? sinh. N?u qu v? ? lm s?ch ??i trng ?? th?c hi?n th? thu?t, qu v? c th? khng ?i ??i ti?n nh? bnh th??ng trong vi ngy.  Vui Lng L?u : Qu v? c th? b? kch ?ng v ngh?t m?i ho?c ch?y n??c m?i. Tnh tr?ng ny l do ?nh h??ng c?a vi?c th? bnh oxy trong qu trnh th?c hi?n th? thu?t. Qu v? khng c?n lo l?ng, tnh tr?ng ny s? bi?n m?t sau m?t ho?c vi ngy.   CC TRI?U CH?NG C?N BO CO NGAY  Sau khi th?c hi?n n?i soi d??i (n?i soi ??i trng ho?c soi k?t trng xch-ma b?ng ?ng m?m): Phn c nhi?u mu ?au b?ng d? d?i ho?c ngy cng t?ng Xu?t hi?n v?t s?ng b?ng m?i, c?p tnh S?t t? 100F tr? ln   Sau khi th?c hi?n n?i soi trn (EGD)  Nn ra mu ho?c ch?t mu c ph s?m Xu?t hi?n c?n ?au ng?c ho?c ?au d??i x??ng b? vai m?i Nu?t ?au ho?c kh nu?t M?i b? kh th? S?t t? 100F tr? ln Phn ?en nh? m?c  ??i v?i cc v?n ?? kh?n c?p ho?c c?p c?u, qu v? c th? lin h? bc s? chuyn khoa tiu ha b?t k? lc no b?ng cch g?i ??n s? [phone].   CH? ?? ?N U?NG: Chng ti [employer] v? tr??c tin nn ?n nh?, nh?ng sau ? qu v? c th? ?n theo ch? ?? bnh th??ng. U?ng nhi?u n??c nh?ng ph?i trnh ?? u?ng c c?n trong 24 gi?.  HO?T ??NG: Qu v? c?n ln k? ho?ch ?? ngh? ng?i trong ngy hm nay v [facility] NN LI XE ho?c s? d?ng my mc n?ng cho ??n ngy Martasia (do tc d?ng c?a thu?c an th?n s? d?ng trong th? thu?t).   THEO DI: Nhn vin c?a chng ti s? g?i cho qu v? theo s?  ?i?n tho?i trong b?nh n vo ngy lm vi?c ti?p theo sau ngy th?c hi?n th? thu?t ?? ki?m tra tnh tr?ng c?a qu v? v gi?i ?p cc cu h?i ho?c th?c m?c c?a qu v? v? thng tin m qu v? ???c cung  c?p sau khi th?c hi?n th? thu?t. N?u chng ti khng lin l?c ???c v?i qu v?, chng ti s? ?? l?i tin nh?n. Tuy nhin, n?u qu v? c?m th?y kh?e v khng g?p b?t k? s? c? no, qu v? khng c?n g?i l?i cho chng ti. Chng ti s? gi? ??nh r?ng qu v? ? tr? l?i sinh ho?t bnh th??ng v khng g?p b?t k? s? c? no.  N?u qu v? ???c l?y sinh thi?t, chng ti s? lin l?c v?i qu v? qua ?i?n tho?i ho?c th? trong 1-3 tu?n ti?p theo. Vui lng g?i cho chng ti theo s? [phone] n?u qu v? khng nh?n ???c thng tin v? k?t qu? sinh thi?t trong 3 tu?n.  CH? K/B?O M?TSander Nephew v? v/ho?c ??i tc ch?m [place] c?a qu v? ? k vo cc ti li?u s? ???c nh?p vo h? s? y t? ?i?n t? c?a qu v?. Ch? k ny xc nh?n r?ng cc thng tin trn ?y trong B?n Tm T?t Sau Khi Th?m Khm c?a qu v? ? ???c xem xt v hi?u r. Qu v? v/ho?c

## 2020-07-17 NOTE — Progress Notes (Signed)
Interpreter used today at the [facility] for this pt.  Interpreter's name is- Iv Nay

## 2020-07-17 NOTE — Op Note (Signed)
[place] Patient Name: [name] Procedure Date: 07/17/2020 8:42 AM MRN: [id number] Endoscopist: Milus Banister , MD Age: 55 Referring MD:  Date of Birth: November 03, 1965 Gender: Female Account #: [id number] Procedure:                Colonoscopy Indications:              Hematochezia, anal itching Medicines:                Monitored Anesthesia Care Procedure:                Pre-Anesthesia Assessment:                           - Prior to the procedure, a History and Physical                            was performed, and patient medications and                            allergies were reviewed. The patient's tolerance of                            previous anesthesia was also reviewed. The risks                            and benefits of the procedure and the sedation                            options and risks were discussed with the patient.                            All questions were answered, and informed consent                            was obtained. Prior Anticoagulants: The patient has                            taken no previous anticoagulant or antiplatelet                            agents. ASA Grade Assessment: II - A patient with                            mild systemic disease. After reviewing the risks                            and benefits, the patient was deemed in                            satisfactory condition to undergo the procedure.                           After obtaining informed consent, the colonoscope  was passed under direct vision. Throughout the                            procedure, the patient's blood pressure, pulse, and                            oxygen saturations were monitored continuously. The                            Olympus PFC-H190DL [id number]) Colonoscope was                            introduced through the anus and advanced to the the                            cecum, identified by appendiceal orifice  and                            ileocecal valve. The colonoscopy was performed                            without difficulty. The patient tolerated the                            procedure well. The quality of the bowel                            preparation was good. The ileocecal valve,                            appendiceal orifice, and rectum were photographed. Scope In: 8:48:31 AM Scope Out: 9:02:41 AM Scope Withdrawal Time: 0 hours 7 minutes 54 seconds  Total Procedure Duration: 0 hours 14 minutes 10 seconds  Findings:                 A 6 mm polyp was found in the distal rectum. The                            polyp was sessile. The polyp was removed with a                            cold snare. Resection and retrieval were complete.                           The exam was otherwise without abnormality on                            direct and retroflexion views. Complications:            No immediate complications. Estimated blood loss:                            None. Estimated Blood Loss:     Estimated blood loss: none. Impression:               -  One 6 mm polyp in the distal rectum, removed with                            a cold snare. Resected and retrieved.                           - No hemorrhoids, fissure or perianal dermatitis.                           - The examination was otherwise normal on direct                            and retroflexion views. Recommendation:           - Patient has a contact number available for                            emergencies. The signs and symptoms of potential                            delayed complications were discussed with the                            patient. Return to normal activities tomorrow.                            Written discharge instructions were provided to the                            patient.                           - Resume previous diet.                           - Continue present medications. Use baby wipe as                             final cleaning after wiping with toilette paper                            following BM                           - Await pathology results. Milus Banister, MD 07/17/2020 9:06:47 AM This report has been signed electronically.

## 2020-07-17 NOTE — Progress Notes (Signed)
Called to room to assist during endoscopic procedure.  Patient ID and intended procedure confirmed with present staff. Received instructions for my participation in the procedure from the performing physician.  

## 2020-07-17 NOTE — Progress Notes (Signed)
Vs in adm by CW 

## 2020-07-17 NOTE — Progress Notes (Signed)
pt tolerated well. VSS. awake and to recovery. Report given to RN.  

## 2020-07-17 NOTE — Progress Notes (Addendum)
error 

## 2020-07-19 ENCOUNTER — Telehealth: Payer: Self-pay

## 2020-07-19 NOTE — Telephone Encounter (Signed)
  Follow up Call-  Call back number 07/17/2020  Post procedure Call Back phone  # [phone]  Permission to leave phone message Yes  Some recent data might be hidden     Patient questions:  Do you have a fever, pain , or abdominal swelling? No. Pain Score  0 *  Have you tolerated food without any problems? Yes.    Have you been able to return to your normal activities? Yes.    Do you have any questions about your discharge instructions: Diet   No. Medications  No. Follow up visit  No.  Do you have questions or concerns about your Care? No.  Actions: * If pain score is 4 or above: No action needed, pain <4.  1. Have you developed a fever since your procedure? no  2.   Have you had an respiratory symptoms (SOB or cough) since your procedure? no  3.   Have you tested positive for COVID 19 since your procedure no  4.   Have you had any family members/close contacts diagnosed with the COVID 19 since your procedure?  no   If yes to any of these questions please route to Joylene John, RN and Joella Prince, RN

## 2020-07-30 ENCOUNTER — Encounter: Payer: Self-pay | Admitting: Gastroenterology

## 2020-09-06 ENCOUNTER — Other Ambulatory Visit: Payer: Self-pay | Admitting: Nurse Practitioner

## 2020-10-30 ENCOUNTER — Other Ambulatory Visit: Payer: Self-pay | Admitting: Gastroenterology

## 2020-11-14 ENCOUNTER — Other Ambulatory Visit: Payer: Self-pay | Admitting: Family Medicine

## 2020-11-14 DIAGNOSIS — Z1231 Encounter for screening mammogram for malignant neoplasm of breast: Secondary | ICD-10-CM

## 2020-12-11 ENCOUNTER — Other Ambulatory Visit: Payer: Self-pay | Admitting: Family Medicine

## 2021-01-08 ENCOUNTER — Other Ambulatory Visit: Payer: Self-pay

## 2021-01-08 ENCOUNTER — Ambulatory Visit
Admission: RE | Admit: 2021-01-08 | Discharge: 2021-01-08 | Disposition: A | Discharge status: Home / Self Care | Payer: 59 | Source: Ambulatory Visit | Attending: Family Medicine | Admitting: Family Medicine

## 2021-01-08 DIAGNOSIS — Z1231 Encounter for screening mammogram for malignant neoplasm of breast: Secondary | ICD-10-CM

## 2021-04-25 ENCOUNTER — Encounter: Payer: Self-pay | Admitting: Family Medicine

## 2021-04-25 ENCOUNTER — Other Ambulatory Visit: Payer: Self-pay

## 2021-04-25 ENCOUNTER — Ambulatory Visit (INDEPENDENT_AMBULATORY_CARE_PROVIDER_SITE_OTHER): Payer: 59 | Admitting: Family Medicine

## 2021-04-25 VITALS — BP 120/65 | HR 56 | Ht <= 58 in | Wt 103.1 lb

## 2021-04-25 DIAGNOSIS — R17 Unspecified jaundice: Secondary | ICD-10-CM | POA: Diagnosis not present

## 2021-04-25 DIAGNOSIS — S86911A Strain of unspecified muscle(s) and tendon(s) at lower leg level, right leg, initial encounter: Secondary | ICD-10-CM

## 2021-04-25 NOTE — Progress Notes (Signed)
° ° °  SUBJECTIVE:   CHIEF COMPLAINT / HPI:   Patient presents for acute concern, she is accompanied by her daughter who serves as interpretor per patient preference.  Unilateral Leg Pain First started about 2 weeks ago, started in her right thigh and radiates down to her calf muscles. Worse when she sits down or stands up. Relieivng factors include walking. Describes pain as sharp, pulling pain. Denies trauma or injury, no inciting event. When the pain first started, when she stood up and suddenly felt the pain. She normally exercises about 20 minutes but that day decided to exercise for 45 minutes instead. Cycling at the time. Denies tingling or loss of sensation. Has not been exercising since the pain started but able to do other things. Tries to avoid driving with the pain. Has had a minor version of this pain years ago which spontaneously resolved. Sometimes also experiences simultaneous back pain but it is not bad enough for her to notice. Denies history of cancer, denies groin paresthesia and denies pain so bad that it wakes her up at night. Has been taking chinese herbal supplements that are not helping.   Yellowing of skin First noticed yellowing of toes about 3 weeks ago when patient sent daughter a picture of this. Went to the dermatologist who told her to go see her PCP which is why she is here today. No medications, no changes to anything since it started. Denies any similar symptoms within the family. States that this has improved and daughter was the only one worried about it as patient is more concerned about her leg pain and does not notice this to be an issue.   OBJECTIVE:   BP 120/65    Pulse (!) 56    Ht 4\' 10"  (1.473 m)    Wt 103 lb 2 oz (46.8 kg)    LMP 06/28/2012 (Approximate)    SpO2 100%    BMI 21.55 kg/m   General: Patient well-appearing, in no acute distress. HEENT: no scleral icterus noted CV: RRR, no murmurs or gallops auscultated Resp: CTAB Abdomen: soft, nontender  upon deep palpation, nondistended, presence of bowel sounds MSK: negative straight leg raise bilaterally, mildly tender upon very deep palpation inferior to right patella, no calf tenderness bilaterally  Neuro: normal gait, gross sensation intact, 5/5 LE strength bilaterally  Psych: mood appropriate   ASSESSMENT/PLAN:   Yellow skin -improving, reassurance provided  -given exam results, no obvious sign of jaundice. Last CMP last year demonstrated LFTs within normal range. Will obtained repeat CMP and notify patient of any abnormal results -follow up if worsening   Muscle strain of lower leg, right, initial encounter -likely due to MSK etiology, RICE therapy discussed -no indication for imaging at this time, plan to follow up in 1-2 months, may consider imaging at this time if pain persists     [name], [place]

## 2021-04-25 NOTE — Patient Instructions (Addendum)
It was great seeing you today!  Today we discussed your leg pain, this seems to be due to a combination of muscle strain. You may use a heating pad, bengay and take ibuprofen as needed for the pain. Please do not exert yourself with exercising, once the pain improves, you may resume regular exercise.  As far as your skin, it looked fine to me but we will check your liver with some blood work today and I will notify you of any abnormal results.   Please follow up at your next scheduled appointment, if anything arises between now and then, please don't hesitate to contact our office.   Thank you for allowing [place] to be a part of your medical care!  Thank you, Dr. Larae Grooms

## 2021-04-25 NOTE — Assessment & Plan Note (Signed)
-  likely due to MSK etiology, RICE therapy discussed -no indication for imaging at this time, plan to follow up in 1-2 months, may consider imaging at this time if pain persists

## 2021-04-25 NOTE — Assessment & Plan Note (Signed)
-  improving, reassurance provided  -given exam results, no obvious sign of jaundice. Last CMP last year demonstrated LFTs within normal range. Will obtained repeat CMP and notify patient of any abnormal results -follow up if worsening

## 2021-04-26 ENCOUNTER — Encounter: Payer: Self-pay | Admitting: Family Medicine

## 2021-04-26 LAB — COMPREHENSIVE METABOLIC PANEL
ALT: 22 IU/L (ref 0–32)
AST: 21 IU/L (ref 0–40)
Albumin/Globulin Ratio: 1.9 (ref 1.2–2.2)
Albumin: 4.6 g/dL (ref 3.8–4.9)
Alkaline Phosphatase: 75 IU/L (ref 44–121)
BUN/Creatinine Ratio: 21 (ref 9–23)
BUN: 14 mg/dL (ref 6–24)
Bilirubin Total: 0.4 mg/dL (ref 0.0–1.2)
CO2: 25 mmol/L (ref 20–29)
Calcium: 9.6 mg/dL (ref 8.7–10.2)
Chloride: 100 mmol/L (ref 96–106)
Creatinine, Ser: 0.66 mg/dL (ref 0.57–1.00)
Globulin, Total: 2.4 g/dL (ref 1.5–4.5)
Glucose: 90 mg/dL (ref 70–99)
Potassium: 4.8 mmol/L (ref 3.5–5.2)
Sodium: 139 mmol/L (ref 134–144)
Total Protein: 7 g/dL (ref 6.0–8.5)
eGFR: 102 mL/min/{1.73_m2} (ref >59)

## 2021-07-30 ENCOUNTER — Encounter: Payer: Self-pay | Admitting: Obstetrics and Gynecology

## 2021-07-30 ENCOUNTER — Other Ambulatory Visit (HOSPITAL_COMMUNITY)
Admission: RE | Admit: 2021-07-30 | Discharge: 2021-07-30 | Disposition: A | Discharge status: Home / Self Care | Payer: 59 | Source: Ambulatory Visit | Attending: Obstetrics and Gynecology | Admitting: Obstetrics and Gynecology

## 2021-07-30 ENCOUNTER — Other Ambulatory Visit: Payer: Self-pay

## 2021-07-30 ENCOUNTER — Ambulatory Visit (INDEPENDENT_AMBULATORY_CARE_PROVIDER_SITE_OTHER): Payer: 59 | Admitting: Obstetrics and Gynecology

## 2021-07-30 VITALS — BP 128/70 | HR 64 | Ht <= 58 in | Wt 95.0 lb

## 2021-07-30 DIAGNOSIS — Z119 Encounter for screening for infectious and parasitic diseases, unspecified: Secondary | ICD-10-CM | POA: Insufficient documentation

## 2021-07-30 DIAGNOSIS — Z113 Encounter for screening for infections with a predominantly sexual mode of transmission: Secondary | ICD-10-CM | POA: Insufficient documentation

## 2021-07-30 DIAGNOSIS — Z124 Encounter for screening for malignant neoplasm of cervix: Secondary | ICD-10-CM | POA: Insufficient documentation

## 2021-07-30 DIAGNOSIS — Z01419 Encounter for gynecological examination (general) (routine) without abnormal findings: Secondary | ICD-10-CM | POA: Diagnosis not present

## 2021-07-30 NOTE — Progress Notes (Signed)
56 y.o. [place] female here for annual exam.   ?Interpretor present today.  ? ?Has vaginal dryness.  ? ?Denies vaginal bleeding.  ? ?Not sexually active for several years.  ?Desires STD screening.  ? ?She notices a scratch on her right breast.  ?No lumps.  ?She has normal mammograms. ? ?Prior patient of Dr. Newton Pigg. ? ?PCP:  Chrisandra Netters, MD  ? ?Patient's last menstrual period was 06/28/2012 (approximate).     ?  ?    ?Sexually active: No.  ?The current method of family planning is post menopausal status.    ?Exercising: No.  The patient does not participate in regular exercise at present. ?Smoker:  no ? ?Health Maintenance: ?Pap:  2022 normal per patient ?History of abnormal Pap:  no ?MMG:  01-08-21 Neg/BiRads1.  Has appointment for August, 2023.  ?Colonoscopy:  07-17-20 polyp ?BMD:   n/a  Result  n/a ?TDaP:  05-30-14 ?Gardasil:   no ?HIV: 10-04-15 NR ?Hep C: 10-04-15 Neg ?Screening Labs: PCP ? ? reports that she has never smoked. She has never used smokeless tobacco. She reports that she does not drink alcohol and does not use drugs. ? ?Past Medical History:  ?Diagnosis Date  ? Allergy   ? Chest pain 03/06/2016  ?    ? Dizziness 03/08/2015  ? GERD (gastroesophageal reflux disease)   ? Hyperlipidemia   ? BORDERLINE  ? Left flank pain 11/29/2015  ? ? ?Past Surgical History:  ?Procedure Laterality Date  ? CESAREAN SECTION  07/11/1999  ? x1  ? OOPHORECTOMY Left 05/13/2007  ? Dermoid cyst  ? ? ?Current Outpatient Medications  ?Medication Sig Dispense Refill  ? augmented betamethasone dipropionate (DIPROLENE-AF) 0.05 % ointment APPLY TOPICALLY TO THE AFFECTED AREA TWICE DAILY FOR 14 DAYS 30 g 0  ? halobetasol (ULTRAVATE) 0.05 % ointment halobetasol propionate 0.05 % topical ointment ? APPLY TO THE AFFECTED AREAS EVERY DAY AT NIGHT UNDER OCCLUSION.    ? hydrocortisone (ANUSOL-HC) 2.5 % rectal cream Place 1 application rectally at bedtime. 15 g 0  ? hydrocortisone (ANUSOL-HC) 25 MG suppository Place 1  suppository (25 mg total) rectally at bedtime. 5 suppository 0  ? loratadine (CLARITIN) 10 MG tablet Take 10 mg by mouth daily.    ? omeprazole (PRILOSEC) 40 MG capsule TAKE 1 CAPSULE(40 MG) BY MOUTH DAILY 30 capsule 11  ? ?Current Facility-Administered Medications  ?Medication Dose Route Frequency Provider Last Rate Last Admin  ? 0.9 %  sodium chloride infusion  500 mL Intravenous Once Milus Banister, MD      ? ? ?Family History  ?Problem Relation Age of Onset  ? Hypertension Mother   ? Hypertension Father   ? Diabetes Father   ? Heart disease Father   ? Diabetes Sister   ? Hypertension Brother   ? Diabetes Brother   ? Colon cancer Neg Hx   ? Rectal cancer Neg Hx   ? Stomach cancer Neg Hx   ? Pancreatic cancer Neg Hx   ? ? ?Review of Systems  ?All other systems reviewed and are negative. ? ?Exam:   ?BP 128/70   Pulse 64   Ht '4\' 10"'$  (1.473 m)   Wt 95 lb (43.1 kg)   LMP 06/28/2012 (Approximate)   SpO2 98%   BMI 19.86 kg/m?     ?General appearance: alert, cooperative and appears stated age ?Head: normocephalic, without obvious abnormality, atraumatic ?Neck: no adenopathy, supple, symmetrical, trachea midline and thyroid normal to inspection and palpation ?Lungs:  clear to auscultation bilaterally ?Breasts: normal appearance, no masses or tenderness, No nipple retraction or dimpling, No nipple discharge or bleeding, No axillary adenopathy ?Heart: regular rate and rhythm ?Abdomen: soft, non-tender; no masses, no organomegaly ?Extremities: extremities normal, atraumatic, no cyanosis or edema ?Skin: skin color, texture, turgor normal. No rashes or lesions ?Lymph nodes: cervical, supraclavicular, and axillary nodes normal. ?Neurologic: grossly normal ? ?Pelvic: External genitalia:  no lesions ?             No abnormal inguinal nodes palpated. ?             Urethra:  normal appearing urethra with no masses, tenderness or lesions ?             Bartholins and Skenes: normal    ?             Vagina: atrophy noted.  No  lesions.  ?             Cervix: no lesions ?             Pap taken: yes ?Bimanual Exam:  Uterus:  normal size, contour, position, consistency, mobility, non-tender ?             Adnexa: no mass, fullness, tenderness ?             Rectal exam: yes.  Confirms. ?             Anus:  normal sphincter tone, no lesions ? ?Chaperone was present for exam:  Estill Bamberg, CMA ? ?Assessment:   ?Well woman visit with gynecologic exam. ?Status post left salpingo-oophorectomy. Dermoid cyst.  2009 ?Vaginal atrophy.  ?STD and infectious disease screening.  ? ?Plan: ?Mammogram screening discussed. ?Self breast awareness reviewed. ?Pap and HR HPV as above. ?Guidelines for Calcium, Vitamin D, regular exercise program including cardiovascular and weight bearing exercise. ?STD and infectious disease screening.  ?We discussed [place] Jelly, cooking oils, and vaginal estrogens.  No prescription given. ?Follow up annually and prn.  ? ?After visit summary provided.  ? ? ? ?

## 2021-07-30 NOTE — Patient Instructions (Signed)
EXERCISE AND DIET:  We recommended that you start or continue a regular exercise program for good health. Regular exercise means any activity that makes your heart beat faster and makes you sweat.  We recommend exercising at least 30 minutes per day at least 3 days a week, preferably 4 or 5.  We also recommend a diet low in fat and sugar.  Inactivity, poor dietary choices and obesity can cause diabetes, heart attack, stroke, and kidney damage, among others.   ? ?ALCOHOL AND SMOKING:  Women should limit their alcohol intake to no more than 7 drinks/beers/glasses of wine (combined, not each!) per week. Moderation of alcohol intake to this level decreases your risk of breast cancer and liver damage. And of course, no recreational drugs are part of a healthy lifestyle.  And absolutely no smoking or even second hand smoke. Most people know smoking can cause heart and lung diseases, but did you know it also contributes to weakening of your bones? Aging of your skin?  Yellowing of your teeth and nails? ? ?CALCIUM AND VITAMIN D:  Adequate intake of calcium and Vitamin D are recommended.  The recommendations for exact amounts of these supplements seem to change often, but generally speaking 600 mg of calcium (either carbonate or citrate) and 800 units of Vitamin D per day seems prudent. Certain women may benefit from higher intake of Vitamin D.  If you are among these women, your doctor will have told you during your visit.   ? ?PAP SMEARS:  Pap smears, to check for cervical cancer or precancers,  have traditionally been done yearly, although recent scientific advances have shown that most women can have pap smears less often.  However, every woman still should have a physical exam from her gynecologist every year. It will include a breast check, inspection of the vulva and vagina to check for abnormal growths or skin changes, a visual exam of the cervix, and then an exam to evaluate the size and shape of the uterus and  ovaries.  And after 58 years of age, a rectal exam is indicated to check for rectal cancers. We will also provide age appropriate advice regarding health maintenance, like when you should have certain vaccines, screening for sexually transmitted diseases, bone density testing, colonoscopy, mammograms, etc.  ? ?MAMMOGRAMS:  All women over 45 years old should have a yearly mammogram. Many facilities now offer a "3D" mammogram, which may cost around $50 extra out of pocket. If possible,  we recommend you accept the option to have the 3D mammogram performed.  It both reduces the number of women who will be called back for extra views which then turn out to be normal, and it is better than the routine mammogram at detecting truly abnormal areas.   ? ?COLONOSCOPY:  Colonoscopy to screen for colon cancer is recommended for all women at age 18.  We know, you hate the idea of the prep.  We agree, BUT, having colon cancer and not knowing it is worse!!  Colon cancer so often starts as a polyp that can be seen and removed at colonscopy, which can quite literally save your life!  And if your first colonoscopy is normal and you have no family history of colon cancer, most women don't have to have it again for 10 years.  Once every ten years, you can do something that may end up saving your life, right?  We will be happy to help you get it scheduled when you are ready.  Be sure to check your insurance coverage so you understand how much it will cost.  It may be covered as a preventative service at no cost, but you should check your particular policy.   ? ?Calcium Content in Foods ?Calcium is the most abundant mineral in the body. Most of the body's calcium supply is stored in bones and teeth. Calcium helps many parts of the body function normally, including: ?Blood and blood vessels. ?Nerves. ?Hormones. ?Muscles. ?Bones and teeth. ?When your calcium stores are low, you may be at risk for low bone mass, bone loss, and broken bones  (fractures). When you get enough calcium, it helps to support strong bones and teeth throughout your life. ?Calcium is especially important for: ?Children during growth spurts. ?Girls during adolescence. ?Women who are pregnant or breastfeeding. ?Women after their menstrual cycle stops (postmenopause). ?Women whose menstrual cycle has stopped due to anorexia nervosa or regular intense exercise. ?People who cannot eat or digest dairy products. ?Vegans. ?Recommended daily amounts of calcium: ?Women (ages 45 to 52): 1,000 mg per day. ?Women (ages 66 and older): 1,200 mg per day. ?Men (ages 35 to 20): 1,000 mg per day. ?Men (ages 34 and older): 1,200 mg per day. ?Women (ages 21 to 31): 1,300 mg per day. ?Men (ages 44 to 58): 1,300 mg per day. ?General information ?Eat foods that are high in calcium. Try to get most of your calcium from food. ?Some people may benefit from taking calcium supplements. Check with your health care provider or diet and nutrition specialist (dietitian) before starting any calcium supplements. Calcium supplements may interact with certain medicines. Too much calcium may cause other health problems, such as constipation and kidney stones. ?For the body to absorb calcium, it needs vitamin D. Sources of vitamin D include: ?Skin exposure to direct sunlight. ?Foods, such as egg yolks, liver, mushrooms, saltwater fish, and fortified milk. ?Vitamin D supplements. Check with your health care provider or dietitian before starting any vitamin D supplements. ?What foods are high in calcium? ?Foods that are high in calcium contain more than 100 milligrams per serving. ?Fruits ?Fortified orange juice or other fruit juice, 300 mg per 8 oz serving. ?Vegetables ?Collard greens, 360 mg per 8 oz serving. ?Kale, 100 mg per 8 oz serving. ?Bok choy, 160 mg per 8 oz serving. ?Grains ?Fortified ready-to-eat cereals, 100 to 1,000 mg per 8 oz serving. ?Fortified frozen waffles, 200 mg in 2 waffles. ?Oatmeal, 140 mg in 1  cup. ?Meats and other proteins ?Sardines, canned with bones, 325 mg per 3 oz serving. ?Salmon, canned with bones, 180 mg per 3 oz serving. ?Canned shrimp, 125 mg per 3 oz serving. ?Baked beans, 160 mg per 4 oz serving. ?Tofu, firm, made with calcium sulfate, 253 mg per 4 oz serving. ?Dairy ?Yogurt, plain, low-fat, 310 mg per 6 oz serving. ?Nonfat milk, 300 mg per 8 oz serving. ?American cheese, 195 mg per 1 oz serving. ?Cheddar cheese, 205 mg per 1 oz serving. ?Cottage cheese 2%, 105 mg per 4 oz serving. ?Fortified soy, rice, or almond milk, 300 mg per 8 oz serving. ?Mozzarella, part skim, 210 mg per 1 oz serving. ?The items listed above may not be a complete list of foods high in calcium. Actual amounts of calcium may be different depending on processing. Contact a dietitian for more information. ?What foods are lower in calcium? ?Foods that are lower in calcium contain 50 mg or less per serving. ?Fruits ?Apple, about 6 mg. ?Banana, about 12 mg. ?Vegetables ?  Lettuce, 19 mg per 2 oz serving. ?Tomato, about 11 mg. ?Grains ?Rice, 4 mg per 6 oz serving. ?Boiled potatoes, 14 mg per 8 oz serving. ?White bread, 6 mg per slice. ?Meats and other proteins ?Egg, 27 mg per 2 oz serving. ?Red meat, 7 mg per 4 oz serving. ?Chicken, 17 mg per 4 oz serving. ?Fish, cod, or trout, 20 mg per 4 oz serving. ?Dairy ?Cream cheese, regular, 14 mg per 1 Tbsp serving. ?Brie cheese, 50 mg per 1 oz serving. ?Parmesan cheese, 70 mg per 1 Tbsp serving. ?The items listed above may not be a complete list of foods lower in calcium. Actual amounts of calcium may be different depending on processing. Contact a dietitian for more information. ?Summary ?Calcium is an important mineral in the body because it affects many functions. Getting enough calcium helps support strong bones and teeth throughout your life. ?Try to get most of your calcium from food. ?Calcium supplements may interact with certain medicines. Check with your health care provider or  dietitian before starting any calcium supplements. ?This information is not intended to replace advice given to you by your health care provider. Make sure you discuss any questions you have with your hea

## 2021-07-31 LAB — CYTOLOGY - PAP
Chlamydia: NEGATIVE
Comment: NEGATIVE
Comment: NEGATIVE
Comment: NEGATIVE
Comment: NORMAL
Diagnosis: NEGATIVE
High risk HPV: NEGATIVE
Neisseria Gonorrhea: NEGATIVE
Trichomonas: NEGATIVE

## 2021-07-31 LAB — HIV ANTIBODY (ROUTINE TESTING W REFLEX): HIV 1&2 Ab, 4th Generation: NONREACTIVE

## 2021-07-31 LAB — RPR: RPR Ser Ql: NONREACTIVE

## 2021-07-31 LAB — HEPATITIS C ANTIBODY
Hepatitis C Ab: NONREACTIVE
SIGNAL TO CUT-OFF: 0.08 (ref <1.00)

## 2021-08-01 ENCOUNTER — Ambulatory Visit (INDEPENDENT_AMBULATORY_CARE_PROVIDER_SITE_OTHER): Payer: 59 | Admitting: Family Medicine

## 2021-08-01 ENCOUNTER — Ambulatory Visit (INDEPENDENT_AMBULATORY_CARE_PROVIDER_SITE_OTHER): Payer: 59

## 2021-08-01 ENCOUNTER — Other Ambulatory Visit: Payer: Self-pay

## 2021-08-01 ENCOUNTER — Encounter: Payer: Self-pay | Admitting: Family Medicine

## 2021-08-01 VITALS — BP 122/63 | HR 56 | Wt 95.4 lb

## 2021-08-01 DIAGNOSIS — Z0000 Encounter for general adult medical examination without abnormal findings: Secondary | ICD-10-CM

## 2021-08-01 DIAGNOSIS — K219 Gastro-esophageal reflux disease without esophagitis: Secondary | ICD-10-CM | POA: Diagnosis not present

## 2021-08-01 DIAGNOSIS — Z23 Encounter for immunization: Secondary | ICD-10-CM | POA: Diagnosis not present

## 2021-08-01 DIAGNOSIS — E785 Hyperlipidemia, unspecified: Secondary | ICD-10-CM

## 2021-08-01 NOTE — Assessment & Plan Note (Signed)
Well controlled on daily omeprazole. Continue this medication.  ?

## 2021-08-01 NOTE — Progress Notes (Signed)
?  Date of Visit: 08/01/2021  ? ?SUBJECTIVE:  ? ?HPI: ? ?[name] presents today for a well woman exam.  ? ?Concerns today: none ?Sees GYN, just saw, pap UTD, had STI testing all neg ?Exercise: stays active, rides indoor bike ?Diet: has cut back on carbs, eating more vegetables, watching what she eats ?Smoking: no ?Alcohol: no ?Drugs: no ?Mood: no concerns ?Dentist: has not seen in a while ?Cancers in family: none she can think of ? ?GERD - takes prilosec '40mg'$  daily with good relief of symptoms. If she goes a few days without it has reemergence of symptoms.  ? ?OBJECTIVE:  ? ?BP 122/63   Pulse (!) 56   Wt 95 lb 6.4 oz (43.3 kg)   LMP 06/28/2012 (Approximate)   SpO2 100%   BMI 19.94 kg/m?  ?Gen: NAD, pleasant, cooperative ?HEENT: NCAT, no palpable thyromegaly or anterior cervical lymphadenopathy ?Heart: RRR, no murmurs. 2+ radial pulses bilaterally  ?Lungs: CTAB, NWOB ?Abdomen: soft, nontender to palpation ?Neuro: grossly nonfocal, speech normal ?Extremities: no edema ? ?ASSESSMENT/PLAN:  ? ?Health maintenance:  ?-mammogram: UTD, had in August ?-lipid screening: check lipids & CMET today ?-colon cancer screening: had 07/17/20, due march 2029, + tubular adenoma ?-immunizations:  ?Flu: too late in season ?Tdap: UTD ?Shingrix: had one vaccine, discussed getting second dose, she will get at her pharmacy ?COVID: bivalent booster today ?Hep B: reports she is completing the series at [facility] ?-handout given on health maintenance topics ? ?GERD (gastroesophageal reflux disease) ?Well controlled on daily omeprazole. Continue this medication.  ? ?FOLLOW UP: ?Follow up in 1 year for next CPE, sooner if needed ? ?[place]. Ardelia Mems, MD ?[facility]  ?

## 2021-08-01 NOTE — Patient Instructions (Addendum)
It was great to see you again today! ? ?Go to pharmacy to get second dose of Shingrix (shingles vaccine) ? ?COVID booster today ? ?Checking cholesterol, kidneys, liver, blood sugar today ? ?Be well, ?Dr. Ardelia Mems  ? ? ?Health Maintenance, Female ?Adopting a healthy lifestyle and getting preventive care are important in promoting health and wellness. Ask your health care provider about: ?The right schedule for you to have regular tests and exams. ?Things you can do on your own to prevent diseases and keep yourself healthy. ?What should I know about diet, weight, and exercise? ?Eat a healthy diet ? ?Eat a diet that includes plenty of vegetables, fruits, low-fat dairy products, and lean protein. ?Do not eat a lot of foods that are high in solid fats, added sugars, or sodium. ?Maintain a healthy weight ?Body mass index (BMI) is used to identify weight problems. It estimates body fat based on height and weight. Your health care provider can help determine your BMI and help you achieve or maintain a healthy weight. ?Get regular exercise ?Get regular exercise. This is one of the most important things you can do for your health. Most adults should: ?Exercise for at least 150 minutes each week. The exercise should increase your heart rate and make you sweat (moderate-intensity exercise). ?Do strengthening exercises at least twice a week. This is in addition to the moderate-intensity exercise. ?Spend less time sitting. Even light physical activity can be beneficial. ?Watch cholesterol and blood lipids ?Have your blood tested for lipids and cholesterol at 58 years of age, then have this test every 5 years. ?Have your cholesterol levels checked more often if: ?Your lipid or cholesterol levels are high. ?You are older than 58 years of age. ?You are at high risk for heart disease. ?What should I know about cancer screening? ?Depending on your health history and family history, you may need to have cancer screening at various  ages. This may include screening for: ?Breast cancer. ?Cervical cancer. ?Colorectal cancer. ?Skin cancer. ?Lung cancer. ?What should I know about heart disease, diabetes, and high blood pressure? ?Blood pressure and heart disease ?High blood pressure causes heart disease and increases the risk of stroke. This is more likely to develop in people who have high blood pressure readings or are overweight. ?Have your blood pressure checked: ?Every 3-5 years if you are 56-40 years of age. ?Every year if you are 41 years old or older. ?Diabetes ?Have regular diabetes screenings. This checks your fasting blood sugar level. Have the screening done: ?Once every three years after age 37 if you are at a normal weight and have a low risk for diabetes. ?More often and at a younger age if you are overweight or have a high risk for diabetes. ?What should I know about preventing infection? ?Hepatitis B ?If you have a higher risk for hepatitis B, you should be screened for this virus. Talk with your health care provider to find out if you are at risk for hepatitis B infection. ?Hepatitis C ?Testing is recommended for: ?Everyone born from [birth year] through 1967. ?Anyone with known risk factors for hepatitis C. ?Sexually transmitted infections (STIs) ?Get screened for STIs, including gonorrhea and chlamydia, if: ?You are sexually active and are younger than 58 years of age. ?You are older than 58 years of age and your health care provider tells you that you are at risk for this type of infection. ?Your sexual activity has changed since you were last screened, and you are at increased risk for  chlamydia or gonorrhea. Ask your health care provider if you are at risk. ?Ask your health care provider about whether you are at high risk for HIV. Your health care provider may recommend a prescription medicine to help prevent HIV infection. If you choose to take medicine to prevent HIV, you should first get tested for HIV. You should then be tested  every 3 months for as long as you are taking the medicine. ?Pregnancy ?If you are about to stop having your period (premenopausal) and you may become pregnant, seek counseling before you get pregnant. ?Take 400 to 800 micrograms (mcg) of folic acid every day if you become pregnant. ?Ask for birth control (contraception) if you want to prevent pregnancy. ?Osteoporosis and menopause ?Osteoporosis is a disease in which the bones lose minerals and strength with aging. This can result in bone fractures. If you are 40 years old or older, or if you are at risk for osteoporosis and fractures, ask your health care provider if you should: ?Be screened for bone loss. ?Take a calcium or vitamin D supplement to lower your risk of fractures. ?Be given hormone replacement therapy (HRT) to treat symptoms of menopause. ?Follow these instructions at home: ?Alcohol use ?Do not drink alcohol if: ?Your health care provider tells you not to drink. ?You are pregnant, may be pregnant, or are planning to become pregnant. ?If you drink alcohol: ?Limit how much you have to: ?0-1 drink a day. ?Know how much alcohol is in your drink. In the [place] one drink equals one 12 oz bottle of beer (355 mL), one 5 oz glass of wine (148 mL), or one 1? oz glass of hard liquor (44 mL). ?Lifestyle ?Do not use any products that contain nicotine or tobacco. These products include cigarettes, chewing tobacco, and vaping devices, such as e-cigarettes. If you need help quitting, ask your health care provider. ?Do not use street drugs. ?Do not share needles. ?Ask your health care provider for help if you need support or information about quitting drugs. ?General instructions ?Schedule regular health, dental, and eye exams. ?Stay current with your vaccines. ?Tell your health care provider if: ?You often feel depressed. ?You have ever been abused or do not feel safe at home. ?Summary ?Adopting a healthy lifestyle and getting preventive care are important in promoting  health and wellness. ?Follow your health care provider's instructions about healthy diet, exercising, and getting tested or screened for diseases. ?Follow your health care provider's instructions on monitoring your cholesterol and blood pressure. ?This information is not intended to replace advice given to you by your health care provider. Make sure you discuss any questions you have with your health care provider. ?Document Revised: 09/17/2020 Document Reviewed: 09/17/2020 ?Elsevier Patient Education ? [place]. ? ?

## 2021-08-02 ENCOUNTER — Encounter: Payer: Self-pay | Admitting: Family Medicine

## 2021-08-02 LAB — CMP14+EGFR
ALT: 17 IU/L (ref 0–32)
AST: 21 IU/L (ref 0–40)
Albumin/Globulin Ratio: 1.9 (ref 1.2–2.2)
Albumin: 4.7 g/dL (ref 3.8–4.9)
Alkaline Phosphatase: 86 IU/L (ref 44–121)
BUN/Creatinine Ratio: 11 (ref 9–23)
BUN: 8 mg/dL (ref 6–24)
Bilirubin Total: 0.5 mg/dL (ref 0.0–1.2)
CO2: 24 mmol/L (ref 20–29)
Calcium: 9.6 mg/dL (ref 8.7–10.2)
Chloride: 103 mmol/L (ref 96–106)
Creatinine, Ser: 0.72 mg/dL (ref 0.57–1.00)
Globulin, Total: 2.5 g/dL (ref 1.5–4.5)
Glucose: 93 mg/dL (ref 70–99)
Potassium: 4 mmol/L (ref 3.5–5.2)
Sodium: 141 mmol/L (ref 134–144)
Total Protein: 7.2 g/dL (ref 6.0–8.5)
eGFR: 97 mL/min/{1.73_m2} (ref >59)

## 2021-08-02 LAB — LIPID PANEL
Chol/HDL Ratio: 2.9 ratio (ref 0.0–4.4)
Cholesterol, Total: 225 mg/dL — ABNORMAL HIGH (ref 100–199)
HDL: 78 mg/dL (ref >39)
LDL Chol Calc (NIH): 132 mg/dL — ABNORMAL HIGH (ref 0–99)
Triglycerides: 85 mg/dL (ref 0–149)
VLDL Cholesterol Cal: 15 mg/dL (ref 5–40)

## 2021-12-17 ENCOUNTER — Other Ambulatory Visit: Payer: Self-pay | Admitting: Gastroenterology

## 2022-01-07 ENCOUNTER — Other Ambulatory Visit: Payer: Self-pay | Admitting: Family Medicine

## 2022-01-07 DIAGNOSIS — Z1231 Encounter for screening mammogram for malignant neoplasm of breast: Secondary | ICD-10-CM

## 2022-04-17 ENCOUNTER — Other Ambulatory Visit: Payer: Self-pay | Admitting: Gastroenterology

## 2022-04-17 NOTE — Telephone Encounter (Signed)
This is a patient of Dr Jacobs.  Please advise refills as you are DOD pm.  Thank you 

## 2022-04-21 ENCOUNTER — Ambulatory Visit
Admission: RE | Admit: 2022-04-21 | Discharge: 2022-04-21 | Disposition: A | Discharge status: Home / Self Care | Payer: 59 | Source: Ambulatory Visit | Attending: Family Medicine | Admitting: Family Medicine

## 2022-04-21 DIAGNOSIS — Z1231 Encounter for screening mammogram for malignant neoplasm of breast: Secondary | ICD-10-CM | POA: Diagnosis not present

## 2022-06-16 ENCOUNTER — Encounter: Payer: Self-pay | Admitting: Family Medicine

## 2022-06-17 MED ORDER — OMEPRAZOLE 40 MG PO CPDR: 40.0000 mg | DELAYED_RELEASE_CAPSULE | Freq: Every day | ORAL | 1 refills | Status: DC

## 2023-01-19 NOTE — Progress Notes (Signed)
57 y.o. G1P1 Single [place] female here for annual exam.   Interpretor present today.  Some vulvar itching with washing.  This is chronic.  Rare vaginal discharge, milky and off white.  Not sexually active.   No vaginal bleeding.   Has appt with PCP today.  Coughing.  Has chest pain when she takes calcium tablets. She does not otherwise have chest pain.  She states her PCP is aware.  She is asking about chewable calcium and foods rich in calcium.   PCP:   Levert Feinstein, MD  Patient's last menstrual period was 06/28/2012 (approximate).           Sexually active: No.  The current method of family planning is post menopausal status.    Exercising: Yes.     walking Smoker:  no  Health Maintenance: Pap:  07/30/21 neg: HR HPV neg History of abnormal Pap:  no MMG:  04/21/22 Breast Density Cat D, BI-RADS CAT 1 neg Colonoscopy:  07/17/20 polyp BMD:   n/a  Result  n/a TDaP:  05/30/14 Gardasil:   no HIV: 10/04/15 NR Hep C: 10/04/15 neg Screening Labs:  Hb today: PCP, Urine today: not collected    reports that she has never smoked. She has never used smokeless tobacco. She reports that she does not drink alcohol and does not use drugs.  Past Medical History:  Diagnosis Date   Allergy    Chest pain 03/06/2016       Dizziness 03/08/2015   GERD (gastroesophageal reflux disease)    Hyperlipidemia    BORDERLINE   Left flank pain 11/29/2015    Past Surgical History:  Procedure Laterality Date   CESAREAN SECTION  07/11/1999   x1   OOPHORECTOMY Left 05/13/2007   Dermoid cyst    Current Outpatient Medications  Medication Sig Dispense Refill   loratadine (CLARITIN) 10 MG tablet Take 10 mg by mouth daily as needed.     omeprazole (PRILOSEC) 40 MG capsule Take 1 capsule (40 mg total) by mouth daily. 90 capsule 1   No current facility-administered medications for this visit.    Family History  Problem Relation Age of Onset   Hypertension Mother    Hypertension Father     Diabetes Father    Heart disease Father    Diabetes Sister    Hypertension Brother    Diabetes Brother    Colon cancer Neg Hx    Rectal cancer Neg Hx    Stomach cancer Neg Hx    Pancreatic cancer Neg Hx     Review of Systems  Genitourinary:        Bumps on vulva itching  All other systems reviewed and are negative.   Exam:   BP 110/70   Pulse 66   Resp 12   Ht 4' 9.5" (1.461 m)   Wt 98 lb (44.5 kg)   LMP 06/28/2012 (Approximate)   BMI 20.84 kg/m     General appearance: alert, cooperative and appears stated age Head: normocephalic, without obvious abnormality, atraumatic Neck: no adenopathy, supple, symmetrical, trachea midline and thyroid normal to inspection and palpation Lungs: clear to auscultation bilaterally Breasts: normal appearance, no masses or tenderness, No nipple retraction or dimpling, No nipple discharge or bleeding, No axillary adenopathy Heart: regular rate and rhythm Abdomen: soft, non-tender; no masses, no organomegaly Extremities: extremities normal, atraumatic, no cyanosis or edema Skin: skin color, texture, turgor normal. No rashes or lesions Lymph nodes: cervical, supraclavicular, and axillary nodes normal. Neurologic: grossly normal  Pelvic:  External genitalia:   2 mm sebaceous cyst of the right labia majora.  Shaved vulva.              No abnormal inguinal nodes palpated.              Urethra:  normal appearing urethra with no masses, tenderness or lesions              Bartholins and Skenes: normal                 Vagina: normal appearing vagina with normal color and discharge, no lesions.  Atrophy noted.               Cervix: no lesions              Pap taken: no Bimanual Exam:  Uterus:  normal size, contour, position, consistency, mobility, non-tender              Adnexa: no mass, fullness, tenderness              Rectal exam: yes.  Confirms.              Anus:  normal sphincter tone, no lesions  Chaperone was present for exam:  Warren Lacy,  RN  Assessment:   Well woman visit with gynecologic exam. Status post left salpingo-oophorectomy. Dermoid cyst.  2009 Vulvar irritation.  Vaginal atrophy.   Plan: Mammogram screening discussed. Self breast awareness reviewed. Pap and HR HPV in 2028.  Guidelines for Calcium, Vitamin D, regular exercise program including cardiovascular and weight bearing exercise. Discussed Viactive. Calcium rich food discussed.  No treatment for atrophy today.  I briefly discussed vaginal estrogen tx. Follow up annually and prn.

## 2023-01-29 ENCOUNTER — Encounter: Payer: 59 | Admitting: Family Medicine

## 2023-01-30 ENCOUNTER — Other Ambulatory Visit: Payer: Self-pay | Admitting: Family Medicine

## 2023-02-02 ENCOUNTER — Encounter: Payer: Self-pay | Admitting: Family Medicine

## 2023-02-02 ENCOUNTER — Encounter: Payer: Self-pay | Admitting: Obstetrics and Gynecology

## 2023-02-02 ENCOUNTER — Ambulatory Visit (INDEPENDENT_AMBULATORY_CARE_PROVIDER_SITE_OTHER): Payer: 59 | Admitting: Family Medicine

## 2023-02-02 ENCOUNTER — Ambulatory Visit (INDEPENDENT_AMBULATORY_CARE_PROVIDER_SITE_OTHER): Payer: Self-pay | Admitting: Obstetrics and Gynecology

## 2023-02-02 VITALS — BP 110/70 | HR 66 | Resp 12 | Ht <= 58 in | Wt 98.0 lb

## 2023-02-02 VITALS — BP 137/67 | HR 60 | Ht <= 58 in | Wt 97.8 lb

## 2023-02-02 DIAGNOSIS — Z23 Encounter for immunization: Secondary | ICD-10-CM

## 2023-02-02 DIAGNOSIS — Z13220 Encounter for screening for lipoid disorders: Secondary | ICD-10-CM

## 2023-02-02 DIAGNOSIS — L290 Pruritus ani: Secondary | ICD-10-CM

## 2023-02-02 DIAGNOSIS — R052 Subacute cough: Secondary | ICD-10-CM

## 2023-02-02 DIAGNOSIS — Z0000 Encounter for general adult medical examination without abnormal findings: Secondary | ICD-10-CM

## 2023-02-02 DIAGNOSIS — K219 Gastro-esophageal reflux disease without esophagitis: Secondary | ICD-10-CM

## 2023-02-02 DIAGNOSIS — Z131 Encounter for screening for diabetes mellitus: Secondary | ICD-10-CM

## 2023-02-02 DIAGNOSIS — Z01419 Encounter for gynecological examination (general) (routine) without abnormal findings: Secondary | ICD-10-CM | POA: Diagnosis not present

## 2023-02-02 LAB — POCT GLYCOSYLATED HEMOGLOBIN (HGB A1C): Hemoglobin A1C: 5.8 % — AB (ref 4.0–5.6)

## 2023-02-02 MED ORDER — PROMETHAZINE-DM 6.25-15 MG/5ML PO SYRP: 5.0000 mL | ORAL_SOLUTION | Freq: Four times a day (QID) | ORAL | 0 refills | Status: DC | PRN

## 2023-02-02 MED ORDER — OMEPRAZOLE 40 MG PO CPDR: 40.0000 mg | DELAYED_RELEASE_CAPSULE | Freq: Every day | ORAL | 3 refills | Status: DC

## 2023-02-02 MED ORDER — ALBENDAZOLE 200 MG PO TABS: ORAL_TABLET | ORAL | 0 refills | Status: DC

## 2023-02-02 NOTE — Patient Instructions (Addendum)

## 2023-02-02 NOTE — Patient Instructions (Addendum)
It was great to see you again today.  Sent in albendazole for you Refilled omeprazole Sent in cough syrup Go get chest xray Go get second shingles shot and COVID booster at your pharmacy Labs today - kidneys, liver, A1c, lipids  Follow up if not improving or if worsening  Be well, Dr. Pollie Meyer

## 2023-02-02 NOTE — Progress Notes (Unsigned)
  Date of Visit: 02/02/2023   SUBJECTIVE:   HPI:  [name] presents today for follow up.  This appointment was originally scheduled as a physical, but she had a physical this morning at her GYN office so we elected to make this a problem focused visit.  Cough: Has had a cough for a couple of weeks now.  Has tried guaifenesin and dextromethorphan, also tried benzonatate.  Also tried honey.  No relief with these medications.  She and her daughter are concerned because she had pneumonia few years ago.  Want to make sure that there is no pneumonia now.  They are also requesting a promethazine prescription to help with cough.  Anal itching: Has had anal itching for months.  Reports in the past being diagnosed with pinworms and is concerned it could be due to this.  Does have history of hemorrhoids, but states none are present now.  No rectal bleeding.  Had pelvic exam including rectal exam at GYN appointment this morning with nothing abnormal noted in the exam.  GERD: Currently taking omeprazole 40 mg daily.  Does well when on this medication, but if she stops it has symptoms.  Needs refill.  OBJECTIVE:   BP 137/67   Pulse 60   Ht 4\' 10"  (1.473 m)   Wt 97 lb 12.8 oz (44.4 kg)   LMP 06/28/2012 (Approximate)   SpO2 100%   BMI 20.44 kg/m  Gen: No acute distress, pleasant, cooperative, well-appearing HEENT: Normocephalic, atraumatic, oropharynx clear and moist, TMs obscured bilaterally by cerumen, no anterior cervical lymphadenopathy Heart: Regular rate and rhythm, no murmur Lungs: Clear to auscultation bilaterally, normal effort Neuro: Alert, grossly nonfocal, speech normal Ext: No edema  ASSESSMENT/PLAN:   Assessment & Plan Subacute cough Benign exam.  No alarm symptom.  Ordered chest x-ray and promethazine cough syrup to help with symptoms. Healthcare maintenance Encouraged to get shingles vaccine and COVID-vaccine at her pharmacy, declines COVID-vaccine today.  Flu vaccine given today.   Yearly labs obtained today, CMET, lipids, A1c to screen for diabetes Gastroesophageal reflux disease, unspecified whether esophagitis present Doing well on omeprazole, continue, refilled Anal itching Patient concerned about pinworms.  Through shared decision making elected to empirically treat for pinworms with albendazole 400 mg once and then repeat in 2 weeks.  I think this would bring her a lot of mental relief to know any potential pinworms have been treated.   Amanda J. Pollie Meyer, MD [facility]

## 2023-02-03 LAB — LIPID PANEL
Chol/HDL Ratio: 3.1 ratio (ref 0.0–4.4)
Cholesterol, Total: 224 mg/dL — ABNORMAL HIGH (ref 100–199)
HDL: 73 mg/dL (ref >39)
LDL Chol Calc (NIH): 134 mg/dL — ABNORMAL HIGH (ref 0–99)
Triglycerides: 96 mg/dL (ref 0–149)
VLDL Cholesterol Cal: 17 mg/dL (ref 5–40)

## 2023-02-03 LAB — CMP14+EGFR
ALT: 30 IU/L (ref 0–32)
AST: 27 IU/L (ref 0–40)
Albumin: 4.8 g/dL (ref 3.8–4.9)
Alkaline Phosphatase: 89 IU/L (ref 44–121)
BUN/Creatinine Ratio: 13 (ref 9–23)
BUN: 9 mg/dL (ref 6–24)
Bilirubin Total: 0.3 mg/dL (ref 0.0–1.2)
CO2: 27 mmol/L (ref 20–29)
Calcium: 9.7 mg/dL (ref 8.7–10.2)
Chloride: 102 mmol/L (ref 96–106)
Creatinine, Ser: 0.67 mg/dL (ref 0.57–1.00)
Globulin, Total: 2.4 g/dL (ref 1.5–4.5)
Glucose: 94 mg/dL (ref 70–99)
Potassium: 4.5 mmol/L (ref 3.5–5.2)
Sodium: 142 mmol/L (ref 134–144)
Total Protein: 7.2 g/dL (ref 6.0–8.5)
eGFR: 101 mL/min/{1.73_m2} (ref >59)

## 2023-02-03 NOTE — Assessment & Plan Note (Signed)
Encouraged to get shingles vaccine and COVID-vaccine at her pharmacy, declines COVID-vaccine today.  Flu vaccine given today.  Yearly labs obtained today, CMET, lipids, A1c to screen for diabetes

## 2023-02-03 NOTE — Assessment & Plan Note (Signed)
Doing well on omeprazole, continue, refilled

## 2023-02-10 ENCOUNTER — Encounter: Payer: Self-pay | Admitting: Family Medicine

## 2023-02-10 DIAGNOSIS — R7303 Prediabetes: Secondary | ICD-10-CM | POA: Insufficient documentation

## 2023-07-17 ENCOUNTER — Other Ambulatory Visit: Payer: Self-pay | Admitting: Family Medicine

## 2023-07-17 DIAGNOSIS — Z1231 Encounter for screening mammogram for malignant neoplasm of breast: Secondary | ICD-10-CM

## 2023-07-20 ENCOUNTER — Ambulatory Visit: Payer: Medicaid Other | Admitting: Family Medicine

## 2023-07-22 NOTE — Progress Notes (Signed)
 GYNECOLOGY  VISIT   HPI: 58 y.o.   Single  Asian female   G1P1 with Patient's last menstrual period was 06/28/2012 (approximate).   here for: bumps, externally in the groin. This comes and goes and she wants to understand what is going on. They do not hurt and are not fluid filled.  Interpretor present today.   Has 1 - 2 years of lumps that come and go.  Some itching.  No pain.    No drainage.   No self treatment.   No removal of the areas in the past.    Her A1C is elevated.  Doing diet modification.  No medication use.   Will have a recheck next week.   May have hemorrhoids.  The area is itchy.   She has used some over the counter medication which is not helpful.  Uses Miralax for constipation.   GYNECOLOGIC HISTORY: Patient's last menstrual period was 06/28/2012 (approximate). 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   Peeling skin 02/01/2020   Hemorrhoids 01/27/2020   History of pneumonia 07/04/2019   Hyperlipidemia 09/01/2016   RLQ abdominal tenderness 08/28/2016   Abdominal pain, chronic, epigastric 04/24/2016   Right leg numbness 04/24/2016   Bladder wall thickening 12/19/2015   Hematuria 12/05/2015   Constipation 11/29/2015   Blood in stool 11/29/2015   Cervical polyp 10/22/2015   Eustachian tube dysfunction 10/04/2015   Menopausal vaginal dryness 10/04/2015    Change in vision 10/04/2015   GERD (gastroesophageal reflux disease) 03/08/2015   Healthcare maintenance 05/30/2014    Past Medical History:  Diagnosis Date   Allergy    Chest pain 03/06/2016       Dizziness 03/08/2015   GERD (gastroesophageal reflux disease)    Hyperlipidemia    BORDERLINE   Left flank pain 11/29/2015    Past Surgical History:  Procedure Laterality Date   CESAREAN SECTION  07/11/1999   x1   OOPHORECTOMY Left 05/13/2007   Dermoid cyst    Current Outpatient Medications  Medication Sig Dispense Refill   albendazole (ALBENZA) 200 MG tablet Take 2 tablets today on an empty stomach. Repeat in 2 weeks. 4 tablet 0   esomeprazole (NEXIUM) 40 MG capsule Take by mouth.     loratadine (CLARITIN) 10 MG tablet Take 10 mg by mouth daily as needed.     omeprazole (PRILOSEC) 40 MG capsule Take 1 capsule (40 mg total) by mouth daily. 90 capsule 3   No current facility-administered medications for this visit.     ALLERGIES: Beef-derived drug products  Family History  Problem Relation Age of Onset   Hypertension Mother    Hypertension Father    Diabetes Father    Heart disease Father    Diabetes Sister    Hypertension Brother    Diabetes Brother    Colon cancer Neg Hx    Rectal cancer Neg Hx    Stomach cancer Neg Hx    Pancreatic cancer Neg Hx     Social History   Socioeconomic History   Marital status: Single    Spouse name: Not on file   Number of children: 1   Years of education: Not on file   Highest education level: Not on file  Occupational History   Occupation: nail tech  Tobacco Use   Smoking status: Never   Smokeless tobacco: Never  Vaping Use   Vaping status: Never Used  Substance and Sexual Activity   Alcohol use: No    Alcohol/week: 0.0 standard drinks of alcohol   Drug use: No   Sexual activity: Not Currently    Birth control/protection: Post-menopausal    Comment: 1st intercourse- 9 yo, partners- 1, no STD's, no abnormal pap, no DES  exposure  Other Topics Concern   Not on file  Social History Narrative   Not on file   Social Drivers of Health   Financial Resource Strain: Not on file  Food Insecurity: Not on file  Transportation Needs: Not on file  Physical Activity: Not on file  Stress: Not on file  Social Connections: Not on file  Intimate Partner Violence: Not on file    Review of Systems  All other systems reviewed and are negative.   PHYSICAL EXAMINATION:   BP 116/70 (BP Location: Right Arm, Patient Position: Sitting, Cuff Size: Small)   Pulse 67   Ht 4\' 10"  (1.473 m)   Wt 92 lb (41.7 kg)   LMP 06/28/2012 (Approximate)   SpO2 99%   BMI 19.23 kg/m     General appearance: alert, cooperative and appears stated age   Pelvic: External genitalia:  tiny sebaceous cysts of the bilateral inguinal regions and the bilateral labia majora.               Urethra:  normal appearing urethra with no masses, tenderness or lesions              Bartholins and Skenes: normal                 Vagina: normal appearing vagina with normal color and discharge, no lesions              Cervix: no lesions                Bimanual Exam:  Uterus:  normal size, contour, position, consistency, mobility, non-tender              Adnexa: no mass, fullness, tenderness              Rectal exam: Yes.  .  Confirms.              Anus:  normal sphincter tone, no lesions.  Minor hemorrhoid.  Chaperone was present for exam:  Warren Lacy, CMA  ASSESSMENT:  Sebaceous cysts of the vulva.  Perianal irritation.  I suspect this is from potential yeast infection.  Elevated A1C.  Tiny hemorrhoid.   Constipation.   PLAN:  Sebaceous cysts discussed.   Written information given.   Mycolog II ointment for itching and irritation.   I  recommended increased water in diet, high fiber diet and Miralax.  Avoid straining to have BMs.  Annual exam in Sept. 2025.   30 min  total time was spent for this patient encounter, including preparation,  face-to-face counseling with the patient, coordination of care, and documentation of the encounter.

## 2023-07-28 ENCOUNTER — Ambulatory Visit
Admission: RE | Admit: 2023-07-28 | Discharge: 2023-07-28 | Disposition: A | Discharge status: Home / Self Care | Source: Ambulatory Visit | Attending: Family Medicine | Admitting: Family Medicine

## 2023-07-28 DIAGNOSIS — Z1231 Encounter for screening mammogram for malignant neoplasm of breast: Secondary | ICD-10-CM

## 2023-07-29 ENCOUNTER — Encounter: Payer: Self-pay | Admitting: Obstetrics and Gynecology

## 2023-07-29 ENCOUNTER — Ambulatory Visit: Admitting: Obstetrics and Gynecology

## 2023-07-29 VITALS — BP 116/70 | HR 67 | Ht <= 58 in | Wt 92.0 lb

## 2023-07-29 DIAGNOSIS — L290 Pruritus ani: Secondary | ICD-10-CM

## 2023-07-29 DIAGNOSIS — K5900 Constipation, unspecified: Secondary | ICD-10-CM

## 2023-07-29 DIAGNOSIS — L723 Sebaceous cyst: Secondary | ICD-10-CM | POA: Diagnosis not present

## 2023-07-29 MED ORDER — NYSTATIN-TRIAMCINOLONE 100000-0.1 UNIT/GM-% EX OINT: 1.0000 | TOPICAL_OINTMENT | Freq: Two times a day (BID) | CUTANEOUS | 0 refills | Status: DC

## 2023-07-29 NOTE — Patient Instructions (Addendum)
 To bn, Ng??i l?n Constipation, Adult To bn l khi m?t ng??i ?i ??i ti?n t h?n ba l?n trong m?t tu?n, ??i ti?n kh kh?n, ho?c ??i ti?n ra phn (phn) kh, c?ng, ho?c to h?n bnh th??ng. To bn c th? do m?t tnh tr?ng bn trong gy ra. N c th? tr? ln tr?m tr?ng h?n theo ?? tu?i n?u m?t ng??i dng cc lo?i thu?c nh?t ??nh v khng u?ng ?? n??c. Tun th? nh?ng h??ng d?n ny ? nh: ?n v u?ng  ?n th?c ?n c nhi?u ch?t x? nh? ??u, ng? c?c nguyn h?t, tri cy t??i v rau c? t??i. H?n ch? cc lo?i th?c ?n t ch?t x? v giu ch?t bo v ???ng tinh luy?n, ch?ng h?n nh? ?? ?n chin rn ho?c ?? ng?t. Nh?ng th?c ph?m ny bao g?m khoai ty chin, bnh hamburger, bnh quy, k?o v soda. U?ng ?? n??c ?? gi? cho n??c ti?u c mu vng nh?t. H??ng d?n chung T?p th? d?c th??ng xuyn theo ch? d?n c?a chuyn gia ch?m [place] s?c kh?e. C? g?ng t?p th? d?c ? m?c ?? v?a ph?i 150 pht m?i tu?n. ?i v? sinh khi qu v? bu?n ?i ??i ti?n. Khng nh?n ?i ??i ti?n. Ch? s? d?ng thu?c khng k ??n v thu?c k ??n theo ch? d?n c?a chuyn gia ch?m [place] s?c kh?e. Thu?c ny g?m c? b?t k? lo?i th?c ph?m ch?c n?ng c ch?t x? no. Trong khi ??i ti?n: Th?c hnh th? su trong khi th? l?ng b?ng d??i. Th?c hnh th? l?ng sn ch?u. Theo di tnh tr?ng c?a qu v? ?? pht hi?n b?t k? thay ??i no. Hy cho nguyn gia ch?m [place] s?c kh?e bi?t v? cc thay ??i ?. Tun th? t?t c? cc l?n khm theo di theo ch? d?n c?a chuyn gia ch?m [place] s?c kh?e. ?i?u ny c vai tr quan tr?ng. Hy lin l?c v?i chuyn gia ch?m [place] s?c kh?e n?u: Qu v? b? ?au d? d?i h?n. Qu v? b? s?t. Qu v? khng ?i ??i ti?n sau 4 ngy. Qu v? nn. Qu v? khng ?i ho?c qu v? b? s?t cn. Qu v? b? ch?y mu t? l? gi?a hai mng (h?u mn). Phn c?a qu v? m?ng, trng nh? bt ch. Yu c?u tr? gip ngay l?p t?c n?u: Qu v? b? s?t v cc tri?u ch?ng c?a qu v? ??t nhin tr?m tr?ng h?n. Qu v? sn phn ho?c c mu trong phn. B?ng c?a qu v? b? ch??ng ln. Qu v? b? ?au r?t  nhi?u ? b?ng. Qu v? c?m th?y chng m?t ho?c ng?t x?u. Tm t?t To bn l khi m?t ng??i ?i ??i ti?n t h?n ba l?n trong m?t tu?n, ??i ti?n kh kh?n, ho?c ??i ti?n ra phn (phn) kh, c?ng, ho?c to h?n bnh th??ng. ?n th?c ?n c nhi?u ch?t x? nh? ??u, ng? c?c nguyn h?t, tri cy t??i v rau c? t??i. U?ng ?? n??c ?? gi? cho n??c ti?u c mu vng nh?t. Ch? s? d?ng thu?c khng k ??n v thu?c k ??n theo ch? d?n c?a chuyn gia ch?m [place] s?c kh?e. Thu?c ny g?m c? b?t k? lo?i th?c ph?m ch?c n?ng c ch?t x? no. Thng tin ny khng nh?m m?c ?ch thay th? cho l?i khuyn m chuyn gia ch?m [place] s?c kh?e ni v?i qu v?. Hy b?o ??m qu v? ph?i th?o lu?n b?t k? v?n ?? g m qu v? c v?i chuyn gia ch?m [place] s?c kh?e c?a qu v?. Document Revised: 06/01/2019  Document Reviewed: 03/12/2022 Elsevier Patient Education  2024 Elsevier Inc.  Pocket of Fluid in the Skin (Epidermoid Cyst): What to Know  An epidermoid cyst is a pocket of fluid that can form under your skin. It's filled with thick, oily substance that your skin glands make. These cysts occur anywhere on your body. They're usually harmless and don't cause problems unless they get inflamed or infected. What are the causes? An epidermoid cyst may be caused by: A blocked pore or hair follicle. An ingrown hair. This is a hair that curls and re-enters the skin instead of growing straight out of the skin. Skin irritation. Skin injuries. Some conditions that are passed from parent to child (inherited). Human papillomavirus (HPV). This is rare but can cause cysts on the bottom of the feet. Long-term (chronic) sun damage to the skin. What increases the risk? Having acne. Being female. Skin injuries. Being past puberty. Certain rare genetic disorders. What are the signs or symptoms? The only sign of this type of cyst may be a small, painless lump under the skin. When an epidermal cyst ruptures, it may become inflamed. Infections are rare, but symptoms may  include: Redness. Inflammation. Tenderness. Warmth. Fever. A bad-smelling, grayish-white substance draining from the cyst. Pus draining from the cyst. How is this diagnosed? This condition is diagnosed with a physical exam. Sometimes, a tissue sample (biopsy) may need to be looked at under a microscope or tested for bacteria. You may be referred to a health care provider who specializes in skin care. This provider is called a dermatologist. How is this treated? If a cyst becomes inflamed, treatment may include: Opening and draining the cyst. Antibiotics. Steroid shots to lessen inflammation. Surgery to take out cysts that are large, painful, or could turn into cancer. Do not try to open or squeeze a cyst yourself. Follow these instructions at home: Medicines Take your medicines only as told. If you were given antibiotics, take them as told. Do not stop taking them even if you start to feel better. General instructions Keep the area around your cyst clean and dry. Wear loose, dry clothing. Avoid touching your cyst. Check the area around your cyst every day for signs of infection. Check for: Redness, swelling, or pain. Fluid or blood. Warmth. Pus or a bad smell. Keep all follow-up visits to make sure the cyst isn't becoming uncomfortable or infected. Contact a health care provider if: You have any signs of infection. Your cyst doesn't get better or gets worse. You get a cyst that looks different from other cysts you've had. You have a fever. You have redness that spreads from the cyst. This information is not intended to replace advice given to you by your health care provider. Make sure you discuss any questions you have with your health care provider. Document Revised: 12/22/2022 Document Reviewed: 12/12/2022 Elsevier Patient Education  2024 [employer].

## 2023-07-31 ENCOUNTER — Telehealth: Payer: Self-pay

## 2023-07-31 NOTE — Telephone Encounter (Signed)
PA started via CoverMyMeds.

## 2023-08-03 ENCOUNTER — Ambulatory Visit: Payer: Medicaid Other | Admitting: Family Medicine

## 2023-08-03 ENCOUNTER — Encounter: Payer: Self-pay | Admitting: Family Medicine

## 2023-08-03 VITALS — BP 137/79 | HR 51 | Ht <= 58 in | Wt 92.0 lb

## 2023-08-03 DIAGNOSIS — K219 Gastro-esophageal reflux disease without esophagitis: Secondary | ICD-10-CM | POA: Diagnosis not present

## 2023-08-03 DIAGNOSIS — Z0000 Encounter for general adult medical examination without abnormal findings: Secondary | ICD-10-CM

## 2023-08-03 DIAGNOSIS — R7303 Prediabetes: Secondary | ICD-10-CM | POA: Diagnosis not present

## 2023-08-03 LAB — POCT GLYCOSYLATED HEMOGLOBIN (HGB A1C): Hemoglobin A1C: 5.6 % (ref 4.0–5.6)

## 2023-08-03 MED ORDER — SHINGRIX 50 MCG/0.5ML IM SUSR: INTRAMUSCULAR | 0 refills | Status: DC

## 2023-08-03 NOTE — Progress Notes (Signed)
  Date of Visit: 08/03/2023   SUBJECTIVE:   HPI:  [name] presents today for routine follow up. [place] interpreter utilized during this visit.   GERD: currently taking omeprazole 40mg  daily. Doing better generally. Wonders if she should change medications because if she gets very hungry and eats a lot then has discomfort afterwards. A couple days ago ate a lot of vegetables and thinks this has bothered her stomach, but overall improving.  Felt cold in her body 2 days ago. No dysuria, no other symptoms. Has not recurred since  Constipation - uses miralax helps, uses sometimes  Prediabetes - last A1c 5.8. has been avoiding sugary carb heavy foods.  OBJECTIVE:   BP 137/79   Pulse (!) 51   Ht 4\' 10"  (1.473 m)   Wt 92 lb (41.7 kg)   LMP 06/28/2012 (Approximate)   SpO2 100%   BMI 19.23 kg/m  Gen: no acute distress, pleasant cooperative HEENT: normocephalic, atraumatic, moist mucous membranes, oropharynx clear and moist, no thyromegaly, No anterior cervical or supraclavicular lymphadenopathy.  Heart: regular rate and rhythm, no murmur Lungs: clear to auscultation bilaterally, normal work of breathing Abdomen: soft, nontender to palpation, no masses or organomegaly Neuro: alert, speech normal, grossly nonfocal Ext: No appreciable lower extremity edema bilaterally   ASSESSMENT/PLAN:   Assessment & Plan Prediabetes Prior elevated A1c at 5.8.  A1c today improved to 5.6, now outside of prediabetes range. Congratulated her efforts, continue Recheck A1c in 6-12 months  Gastroesophageal reflux disease, unspecified whether esophagitis present Overall stable on prilosec 40mg  daily. Continue this, patient to call if has worsening GERD issues at which time would add famotidine. Healthcare maintenance Needs second dose of shingrix, given rx to get this done   No workup planned for isolated episode of feeling cold 2 days ago which has resolved; patient well appearing today.  [place] J.  Pollie Meyer, MD [facility]

## 2023-08-03 NOTE — Patient Instructions (Signed)
 It was great to see you again today.  Continue current medications  Take shingles vaccine prescription to your pharmacy   Follow up with me in 6-12 months, sooner if needed  Be well, Dr. Pollie Meyer

## 2023-08-03 NOTE — Assessment & Plan Note (Addendum)
 Overall stable on prilosec 40mg  daily. Continue this, patient to call if has worsening GERD issues at which time would add famotidine.

## 2023-08-03 NOTE — Assessment & Plan Note (Signed)
 Needs second dose of shingrix, given rx to get this done

## 2023-08-03 NOTE — Assessment & Plan Note (Addendum)
 Prior elevated A1c at 5.8.  A1c today improved to 5.6, now outside of prediabetes range. Congratulated her efforts, continue Recheck A1c in 6-12 months

## 2023-08-04 ENCOUNTER — Encounter: Payer: Self-pay | Admitting: Obstetrics and Gynecology

## 2023-08-04 ENCOUNTER — Other Ambulatory Visit: Payer: Self-pay | Admitting: Nurse Practitioner

## 2023-08-04 DIAGNOSIS — L293 Anogenital pruritus, unspecified: Secondary | ICD-10-CM

## 2023-08-04 MED ORDER — NYSTATIN 100000 UNIT/GM EX OINT: 1.0000 | TOPICAL_OINTMENT | Freq: Two times a day (BID) | CUTANEOUS | 0 refills | Status: AC

## 2023-08-04 MED ORDER — TRIAMCINOLONE ACETONIDE 0.5 % EX OINT: 1.0000 | TOPICAL_OINTMENT | Freq: Two times a day (BID) | CUTANEOUS | 0 refills | Status: AC

## 2023-08-04 NOTE — Telephone Encounter (Signed)
 PA denied. TW prescribed Mycology and Nystatin separately and sent MyChart msg to communicate with pt

## 2023-08-06 ENCOUNTER — Ambulatory Visit: Payer: Medicaid Other | Admitting: Family Medicine

## 2024-01-27 ENCOUNTER — Other Ambulatory Visit: Payer: Self-pay | Admitting: Family Medicine

## 2024-03-07 ENCOUNTER — Ambulatory Visit (HOSPITAL_COMMUNITY)
Admission: RE | Admit: 2024-03-07 | Discharge: 2024-03-07 | Disposition: A | Discharge status: Home / Self Care | Source: Ambulatory Visit | Attending: Family Medicine | Admitting: Family Medicine

## 2024-03-07 ENCOUNTER — Encounter: Payer: Self-pay | Admitting: Family Medicine

## 2024-03-07 ENCOUNTER — Ambulatory Visit: Admitting: Family Medicine

## 2024-03-07 ENCOUNTER — Other Ambulatory Visit (HOSPITAL_COMMUNITY)

## 2024-03-07 VITALS — BP 119/66 | HR 57 | Ht <= 58 in | Wt 91.2 lb

## 2024-03-07 DIAGNOSIS — R0789 Other chest pain: Secondary | ICD-10-CM

## 2024-03-07 DIAGNOSIS — R079 Chest pain, unspecified: Secondary | ICD-10-CM | POA: Insufficient documentation

## 2024-03-07 DIAGNOSIS — K219 Gastro-esophageal reflux disease without esophagitis: Secondary | ICD-10-CM | POA: Diagnosis not present

## 2024-03-07 DIAGNOSIS — L729 Follicular cyst of the skin and subcutaneous tissue, unspecified: Secondary | ICD-10-CM

## 2024-03-07 DIAGNOSIS — E785 Hyperlipidemia, unspecified: Secondary | ICD-10-CM

## 2024-03-07 DIAGNOSIS — Z23 Encounter for immunization: Secondary | ICD-10-CM | POA: Diagnosis not present

## 2024-03-07 DIAGNOSIS — Z0000 Encounter for general adult medical examination without abnormal findings: Secondary | ICD-10-CM | POA: Diagnosis not present

## 2024-03-07 DIAGNOSIS — R7303 Prediabetes: Secondary | ICD-10-CM

## 2024-03-07 LAB — POCT GLYCOSYLATED HEMOGLOBIN (HGB A1C): Hemoglobin A1C: 5.6 % (ref 4.0–5.6)

## 2024-03-07 NOTE — Patient Instructions (Addendum)
 It was great to see you again today.  Referring to cardiologist and dermatologist  Flu and pneumonia shots today  If you have any chest pain or shortness of breath that does not go away, please go to the ER immediately.   Be well, Dr. Donah

## 2024-03-07 NOTE — Progress Notes (Unsigned)
  Date of Visit: 03/07/2024   SUBJECTIVE:   HPI:  Discussed the use of AI scribe software for clinical note transcription with the patient, who gave verbal consent to proceed. [place] interpreter utilized during this visit.  History of Present Illness [name] is a 58 year old female who presents for an A1c check and evaluation of a mole on her head.  Prediabetes monitoring - Present for follow-up evaluation of A1c levels - History of prediabetes, no medications for this right now  Scalp lesion - Concern regarding a mole on the scalp present for a long duration - No change in size over time - patient is worried for malignancy - Previous treatments for similar scalp lesions at dermatology office  Transient chest discomfort - reports episodes of chest discomfort over recent months - Episodes occurred once or twice, each lasting about one minute - Symptoms resolved after drinking water - No associated shortness of breath, vomiting, or sweating - chest pain not exacerbated by physical activity   OBJECTIVE:   BP 119/66   Pulse (!) 57   Ht 4' 10 (1.473 m)   Wt 91 lb 3.2 oz (41.4 kg)   LMP 06/28/2012 (Approximate)   SpO2 99%   BMI 19.06 kg/m  Gen: no acute distress, pleasant, cooperative, well appearing HEENT: normocephalic, atraumatic  Heart: regular rate and rhythm, no murmur Lungs: clear to auscultation bilaterally, normal work of breathing  Neuro: alert, speech normal, grossly nonfocal Ext: No appreciable lower extremity edema bilaterally  Skin: small 5mm mobile cyst-like structure beneath scalp approximately 1in from anterior hairline, midline on scalp.  ASSESSMENT/PLAN:   Assessment & Plan Prediabetes Updated A1c today - normal Chest tightness Suspect related to GERD, but does have cardiac risk factors (age, hyperlipidemia). EKG today without signs of ischemia - just sinus bradycardia. Last stress test 8 years ago. Refer to cardiology for consideration  of repeat stress test. Reviewed return precautions & reasons to seek emergency care. Scalp cyst Mole on skin consistent with mobile skin cyst. Advised I do not think this is malignant. Patient requesting referral to dermatologist. Entered.  Hyperlipidemia, unspecified hyperlipidemia type Update lipids today, continue statin Routine adult health maintenance Pneumococcal and flu vaccines today UTD on shingrix    Osvaldo Lamping J. Donah, MD [facility]

## 2024-03-08 ENCOUNTER — Ambulatory Visit: Payer: Self-pay | Admitting: Family Medicine

## 2024-03-08 LAB — BASIC METABOLIC PANEL WITH GFR
BUN/Creatinine Ratio: 22 (ref 12–28)
BUN: 15 mg/dL (ref 8–27)
CO2: 24 mmol/L (ref 20–29)
Calcium: 9.4 mg/dL (ref 8.7–10.3)
Chloride: 104 mmol/L (ref 96–106)
Creatinine, Ser: 0.69 mg/dL (ref 0.57–1.00)
Glucose: 90 mg/dL (ref 70–99)
Potassium: 3.7 mmol/L (ref 3.5–5.2)
Sodium: 141 mmol/L (ref 134–144)
eGFR: 99 mL/min/1.73 (ref >59)

## 2024-03-08 LAB — LIPID PANEL
Chol/HDL Ratio: 2.4 ratio (ref 0.0–4.4)
Cholesterol, Total: 217 mg/dL — ABNORMAL HIGH (ref 100–199)
HDL: 91 mg/dL (ref >39)
LDL Chol Calc (NIH): 115 mg/dL — ABNORMAL HIGH (ref 0–99)
Triglycerides: 62 mg/dL (ref 0–149)
VLDL Cholesterol Cal: 11 mg/dL (ref 5–40)

## 2024-03-09 NOTE — Assessment & Plan Note (Signed)
 Updated A1c today - normal

## 2024-03-09 NOTE — Assessment & Plan Note (Signed)
Update lipids today, continue statin 

## 2024-03-09 NOTE — Assessment & Plan Note (Signed)
 Pneumococcal and flu vaccines today UTD on shingrix 

## 2024-03-28 ENCOUNTER — Ambulatory Visit: Attending: Cardiovascular Disease | Admitting: Internal Medicine

## 2024-03-28 VITALS — BP 104/69 | HR 53 | Ht <= 58 in | Wt 91.8 lb

## 2024-03-28 DIAGNOSIS — R072 Precordial pain: Secondary | ICD-10-CM | POA: Insufficient documentation

## 2024-03-28 DIAGNOSIS — R001 Bradycardia, unspecified: Secondary | ICD-10-CM | POA: Insufficient documentation

## 2024-03-28 DIAGNOSIS — E785 Hyperlipidemia, unspecified: Secondary | ICD-10-CM | POA: Diagnosis not present

## 2024-03-28 NOTE — Patient Instructions (Addendum)
 Medication Instructions:  Your physician recommends that you continue on your current medications as directed. Please refer to the Current Medication list given to you today.  *If you need a refill on your cardiac medications before your next appointment, please call your pharmacy*   Testing/Procedures: Your cardiac CT will be scheduled at one of the below locations:   [facility] [address]  [place], [place] [address]  If scheduled at the [facility] at [address], please enter the parking lot using the [address] entrance and use the FREE valet service at the patient drop-off area. Enter the building and check-in with registration on the main floor  Please follow these instructions carefully (unless otherwise directed):  An IV will be required for this test and Nitroglycerin will be given.   On the Night Before the Test: Be sure to Drink plenty of water. Do not consume any caffeinated/decaffeinated beverages or chocolate 12 hours prior to your test. Do not take any antihistamines 12 hours prior to your test.  On the Day of the Test: Drink plenty of water until 1 hour prior to the test. Do not eat any food 1 hour prior to test. You may take your regular medications prior to the test.  If you take Furosemide/Hydrochlorothiazide/Spironolactone/Chlorthalidone, please HOLD on the morning of the test. Patients who wear a continuous glucose monitor MUST remove the device prior to scanning. FEMALES- please wear underwire-free bra if available, avoid dresses & tight clothing      After the Test: Drink plenty of water. After receiving IV contrast, you may experience a mild flushed feeling. This is normal. On occasion, you may experience a mild rash up to 24 hours after the test. This is not dangerous. If this occurs, you can take Benadryl 25 mg, Zyrtec, Claritin, or Allegra and increase your fluid intake. (Patients taking Tikosyn should  avoid Benadryl, and may take Zyrtec, Claritin, or Allegra) If you experience trouble breathing, this can be serious. If it is severe call [phone] IMMEDIATELY. If it is mild, please call our office.  We will call to schedule your test 2-4 weeks out understanding that some insurance companies will need an authorization prior to the service being performed.   For more information and frequently asked questions, please visit our website : [email or web address]  For non-scheduling related questions, please contact the cardiac imaging nurse navigator should you have any questions/concerns: Cardiac Imaging Nurse Navigators Direct Office Dial: [phone]   For scheduling needs, including cancellations and rescheduling, please call Brittany, [phone].   Follow-Up: At [facility], you and your health needs are our priority.  As part of our continuing mission to provide you with exceptional heart care, our providers are all part of one team.  This team includes your primary Cardiologist (physician) and Advanced Practice Providers or APPs (Physician Assistants and Nurse Practitioners) who all work together to provide you with the care you need, when you need it.  Your next appointment:   3 month(s)  Provider:   One of our Advanced Practice Providers (APPs): Morse Clause, PA-C  Lamarr Satterfield, NP Miriam Shams, NP  Olivia Pavy, PA-C Josefa Beauvais, NP  Leontine Salen, PA-C Orren Fabry, PA-C  [place], PA-C Ernest Dick, NP  Damien Braver, NP Jon Hails, PA-C  Waddell Donath, PA-C    Dayna Dunn, PA-C  Scott Weaver, PA-C Lum Louis, NP Katlyn West, NP Callie Goodrich, PA-C  Xika Zhao, NP Sheng Haley, PA-C  Rollo Louder, PA-C

## 2024-03-28 NOTE — Progress Notes (Signed)
  Cardiology Office Note:  .    Date:  03/28/2024  ID:  [name], DOB [birth year], MRN [id number] PCP: Donah Laymon PARAS, MD  [facility] Providers Cardiologist:  None     CC: Chest pain Consulted for the evaluation of chest pain at the behest of Dr. Donah  History of Present Illness: [name]    [address] [name] is a 58 y.o. female with FHX of CAD and known bradycardia.  She experiences intermittent chest pain described as a squeezing sensation in the center or slightly to the left of her chest. The pain occurs sporadically, sometimes during sleep or while sitting, and is associated with tiredness during chores. These symptoms have been present for several years.GT did POET with no sx in 2017.  Her family history is significant for coronary artery disease and she also has hyperlipidemia.  Her sister just had a CABG and is present today.  Discussed the use of AI scribe software for clinical note transcription with the patient, who gave verbal consent to proceed. Translator at bedside.  Relevant histories: .  Social  - former GT; speaks Vietnamese ROS: As per HPI.   Studies Reviewed: .     Cardiac Studies & Procedures   ______________________________________________________________________________________________   STRESS TESTS  EXERCISE TOLERANCE TEST (ETT) 04/18/2016  Interpretation Summary  Blood pressure demonstrated a normal response to exercise.  There was no ST segment deviation noted during stress.  Normal exercise tolerance test with no ishcemia at an adequate heart rate response of 91% MPHR and good workload of 10.1 mets and a  This is a low risk study.            ______________________________________________________________________________________________       Risk Assessment/Calculations:    NA      Physical Exam:    VS:  BP 104/69   Pulse (!) 53   Ht 4' 10 (1.473 m)   Wt 91 lb 12.8 oz (41.6 kg)   LMP 06/28/2012  (Approximate)   SpO2 98%   BMI 19.19 kg/m    Wt Readings from Last 3 Encounters:  03/28/24 91 lb 12.8 oz (41.6 kg)  03/07/24 91 lb 3.2 oz (41.4 kg)  08/03/23 92 lb (41.7 kg)    Gen: no distress Cardiac: No Rubs or Gallops, no Murmur, regular bradycardia +2 radial pulses Respiratory: Clear to auscultation bilaterally, normal effort, normal  respiratory rate GI: Soft, nontender, non-distended  MS: No  edema;  moves all extremities Integument: Skin feels warm Neuro:  At time of evaluation, alert and oriented to person/place/time/situation  Psych: Normal affect, patient feels ok       ASSESSMENT AND PLAN: .    Precordial chest pain with family history of CAD  Intermittent precordial chest pain, described as squeezing, occurring during sleep, rest, and exertion. Previous stress test was normal. Differential diagnosis includes no significant findings, mild blockages not causing symptoms, or significant obstructive coronary disease requiring treatment. - Ordered coronary CT to evaluate for obstructive coronary disease  Sinus bradycardia Noted in the context of coronary artery disease and chest pain. No pretreatment needed  Hyperlipidemia Pre-diabetes - Continue aggressive prevention strategies for coronary artery disease; A1c has Improved  Three month f/u with my team unless obstructive CAD  Stanly Leavens, MD FASE [facility] Cardiologist [facility]  [address], [address] [place], [place] [address] [phone]  11:55 AM

## 2024-05-04 ENCOUNTER — Ambulatory Visit (HOSPITAL_COMMUNITY)
Admission: RE | Admit: 2024-05-04 | Discharge: 2024-05-04 | Disposition: A | Discharge status: Home / Self Care | Source: Ambulatory Visit | Attending: Cardiovascular Disease | Admitting: Cardiovascular Disease

## 2024-05-04 DIAGNOSIS — R072 Precordial pain: Secondary | ICD-10-CM | POA: Diagnosis not present

## 2024-05-04 DIAGNOSIS — E785 Hyperlipidemia, unspecified: Secondary | ICD-10-CM | POA: Diagnosis not present

## 2024-05-04 MED ORDER — IOHEXOL 350 MG/ML SOLN
100.0000 mL | Freq: Once | INTRAVENOUS | Status: AC | PRN
  Administered 2024-05-04: 100 mL via INTRAVENOUS

## 2024-05-04 MED ORDER — NITROGLYCERIN 0.4 MG SL SUBL
0.8000 mg | SUBLINGUAL_TABLET | Freq: Once | SUBLINGUAL | Status: AC
  Administered 2024-05-04: 0.8 mg via SUBLINGUAL

## 2024-05-09 ENCOUNTER — Ambulatory Visit: Payer: Self-pay

## 2024-05-24 ENCOUNTER — Ambulatory Visit (INDEPENDENT_AMBULATORY_CARE_PROVIDER_SITE_OTHER)

## 2024-05-24 ENCOUNTER — Encounter (HOSPITAL_COMMUNITY): Payer: Self-pay

## 2024-05-24 ENCOUNTER — Ambulatory Visit (HOSPITAL_COMMUNITY)
Admission: EM | Admit: 2024-05-24 | Discharge: 2024-05-24 | Disposition: A | Discharge status: Home / Self Care | Attending: Emergency Medicine | Admitting: Emergency Medicine

## 2024-05-24 DIAGNOSIS — R051 Acute cough: Secondary | ICD-10-CM

## 2024-05-24 LAB — POC SOFIA SARS ANTIGEN FIA: SARS Coronavirus 2 Ag: NEGATIVE

## 2024-05-24 MED ORDER — AMOXICILLIN-POT CLAVULANATE 875-125 MG PO TABS: 1.0000 | ORAL_TABLET | Freq: Two times a day (BID) | ORAL | 0 refills | Status: AC

## 2024-05-24 NOTE — Discharge Instructions (Addendum)
 Cxr is negative,no pneumonia Your covid test is negative Take augmentin  for acute sinusitis, push fluids. If you have new or worsening issues go to Er for further evaluation.

## 2024-05-24 NOTE — ED Triage Notes (Signed)
 Patient here today with c/o fever, chills, night sweats, cough, nasal congestion, and fatigue X 9 days. Patient has been taking Sudafed with some relief. No known sick contacts. 4 years ago patient had pneumonia.

## 2024-05-24 NOTE — ED Provider Notes (Signed)
 " [facility]    CSN: [id number] Arrival date & time: 05/24/24  1310      History   Chief Complaint Chief Complaint  Patient presents with   Fever    HPI [facility] [name] is a 59 y.o. female.   59 year old female, [name], presents to urgent care with daughter for evaluation of fever. Pt adamant feels like when she previously had covid and pneumonia. Pt requesting CXR and covid test. Pt has been taking Sudafed with some relief. No known sick contacts. Pt reports covid pneumonia 2020.  The history is provided by the patient. No language interpreter was used.  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   Peeling skin 02/01/2020   Hemorrhoids 01/27/2020   History of pneumonia 07/04/2019   Hyperlipidemia 09/01/2016   RLQ abdominal tenderness 08/28/2016   Abdominal pain, chronic, epigastric 04/24/2016   Right leg numbness 04/24/2016   Bladder wall thickening 12/19/2015   Hematuria 12/05/2015   Constipation 11/29/2015   Blood in stool 11/29/2015   Cervical polyp 10/22/2015   Eustachian tube dysfunction 10/04/2015   Menopausal vaginal dryness 10/04/2015   Change in vision 10/04/2015   GERD (gastroesophageal reflux disease) 03/08/2015   Routine adult health maintenance 05/30/2014    Past Surgical History:  Procedure Laterality Date   CESAREAN SECTION  07/11/1999   x1   OOPHORECTOMY Left 05/13/2007   Dermoid cyst     OB History     Gravida  1   Para  1   Term      Preterm      AB      Living  1      SAB      IAB      Ectopic      Multiple      Live Births               Home Medications    Prior to Admission medications  Medication Sig Start Date End Date Taking? Authorizing Provider  amoxicillin -clavulanate (AUGMENTIN ) 875-125 MG tablet Take 1 tablet by mouth 2 (two) times daily for 5 days. 05/24/24 05/29/24 Yes Ivaan Liddy, NP  loratadine (CLARITIN) 10 MG tablet Take 10 mg by mouth daily as needed.    [provider]  omeprazole  (PRILOSEC) 40 MG capsule TAKE 1 CAPSULE (40 MG TOTAL) BY MOUTH DAILY. 01/27/24   Donzetta Rollene BRAVO, MD    Family History Family History  Problem Relation Age of Onset   Hypertension Mother    Hypertension Father    Diabetes Father    Heart disease Father    Diabetes Sister    Hypertension Brother    Diabetes Brother    Colon cancer Neg Hx    Rectal cancer Neg Hx    Stomach cancer Neg Hx    Pancreatic cancer Neg Hx     Social History  Social History[1]   Allergies   Bovine (beef) protein-containing drug products   Review of Systems Review of Systems  Constitutional:  Positive for chills, diaphoresis, fatigue and fever.  HENT:  Positive for congestion.   Respiratory:  Positive for cough.   All other systems reviewed and are negative.    Physical Exam Triage Vital Signs ED Triage Vitals  Encounter Vitals Group     BP      Girls Systolic BP Percentile      Girls Diastolic BP Percentile      Boys Systolic BP Percentile      Boys Diastolic BP Percentile      Pulse      Resp      Temp      Temp src      SpO2      Weight      Height      Head Circumference      Peak Flow      Pain Score      Pain Loc      Pain Education      Exclude from Growth Chart    No data found.  Updated Vital Signs BP 125/79 (BP Location: Right Arm)   Pulse (!) 59   Temp 98.4 F (36.9 C) (Oral)   Resp 16   LMP  06/28/2012   SpO2 98%   Visual Acuity Right Eye Distance:   Left Eye Distance:   Bilateral Distance:    Right Eye Near:   Left Eye Near:    Bilateral Near:     Physical Exam Vitals and nursing note reviewed.  Constitutional:      General: She is not in acute distress.    Appearance: She is well-developed.  HENT:     Head: Normocephalic.     Right Ear: Tympanic membrane is retracted.     Left Ear: Tympanic membrane is retracted.     Nose: Mucosal edema and congestion present.     Right Sinus: Maxillary sinus tenderness present.     Left Sinus: Maxillary sinus tenderness present.     Mouth/Throat:     Lips: Pink.     Mouth: Mucous membranes are moist.     Pharynx: Uvula midline. Postnasal drip present.  Eyes:     General: Lids are normal.     Conjunctiva/sclera: Conjunctivae normal.     Pupils: Pupils are equal, round, and reactive to light.  Neck:     Trachea: No tracheal deviation.  Cardiovascular:     Rate and Rhythm: Normal rate and regular rhythm.     Heart sounds: Normal heart sounds. No murmur heard. Pulmonary:     Effort: Pulmonary effort is normal.     Breath sounds: Normal breath sounds and air entry.  Abdominal:     General: Bowel sounds are normal.     Palpations: Abdomen is soft.     Tenderness: There is no abdominal tenderness.  Musculoskeletal:        General: Normal range of motion.     Cervical back: Normal range of motion.  Lymphadenopathy:     Cervical: No cervical adenopathy.  Skin:    General: Skin is warm and dry.     Findings: No rash.  Neurological:     General: No focal deficit present.     Mental Status: She is alert and oriented to person, place, and time.     GCS: GCS eye subscore is 4. GCS verbal subscore is 5.  GCS motor subscore is 6.  Psychiatric:        Speech: Speech normal.        Behavior: Behavior normal. Behavior is cooperative.      UC Treatments / Results  Labs (all labs ordered are listed, but only abnormal results  are displayed) Labs Reviewed  POC SOFIA SARS ANTIGEN FIA    EKG   Radiology DG Chest 2 View Result Date: 05/24/2024 CLINICAL DATA:  Cough, fever EXAM: CHEST - 2 VIEW COMPARISON:  February 27, 2019 FINDINGS: The heart size and mediastinal contours are within normal limits. Both lungs are clear. The visualized skeletal structures are unremarkable. IMPRESSION: No active cardiopulmonary disease. Electronically Signed   By: Lynwood Landy Raddle M.D.   On: 05/24/2024 15:42    Procedures Procedures (including critical care time)  Medications Ordered in UC Medications - No data to display  Initial Impression / Assessment and Plan / UC Course  I have reviewed the triage vital signs and the nursing notes.  Pertinent labs & imaging results that were available during my care of the patient were reviewed by me and considered in my medical decision making (see chart for details).  Clinical Course as of 05/24/24 2124  Tue May 24, 2024  1538 Covid negative [JD]    Clinical Course User Index [JD] Ramal Eckhardt, Rilla, NP  Discussed exam findings and plan of care with patient and daughter :Cxr is negative,no pneumonia Your covid test is negative Take augmentin  for acute sinusitis, push fluids. If you have new or worsening issues go to Er for further evaluation.  Patient and daughter both verbalized understanding to this provider  Ddx: Acute respiratory infection(sinusitis), cough,pneumonia, viral illness,allergies Final Clinical Impressions(s) / UC Diagnoses   Final diagnoses:  Acute cough     Discharge Instructions      Cxr is negative,no pneumonia Your covid test is negative Take augmentin  for acute sinusitis, push fluids. If you have new or worsening issues go to Er for further evaluation.      ED Prescriptions     Medication Sig Dispense Auth. Provider   amoxicillin -clavulanate (AUGMENTIN ) 875-125 MG tablet Take 1 tablet by mouth 2 (two) times daily for 5 days. 10 tablet Keeley Sussman,  Rilla, NP      PDMP not reviewed this encounter.     [1]  Social History Tobacco Use   Smoking status: Never   Smokeless tobacco: Never  Vaping Use   Vaping status: Never Used  Substance Use Topics   Alcohol use: No    Alcohol/week: 0.0 standard drinks of alcohol   Drug use: No     Jahi Roza, Rilla, NP 05/24/24 2124  "

## 2024-06-28 ENCOUNTER — Ambulatory Visit: Admitting: Physician Assistant

## 2024-10-17 ENCOUNTER — Ambulatory Visit: Admitting: Physician Assistant
